# Patient Record
Sex: Male | Born: 1955 | Race: White | Hispanic: No | Smoking: Current some day smoker
Health system: Southern US, Community
[De-identification: ages and names within clinical notes are randomized; demographics above are authoritative.]

## PROBLEM LIST (undated history)

## (undated) DIAGNOSIS — G373 Acute transverse myelitis in demyelinating disease of central nervous system: Secondary | ICD-10-CM

## (undated) DIAGNOSIS — M1990 Unspecified osteoarthritis, unspecified site: Secondary | ICD-10-CM

## (undated) DIAGNOSIS — N319 Neuromuscular dysfunction of bladder, unspecified: Secondary | ICD-10-CM

## (undated) HISTORY — DX: Neuromuscular dysfunction of bladder, unspecified: N31.9

## (undated) HISTORY — DX: Unspecified osteoarthritis, unspecified site: M19.90

---

## 1996-08-29 DIAGNOSIS — G373 Acute transverse myelitis in demyelinating disease of central nervous system: Secondary | ICD-10-CM

## 1996-08-29 HISTORY — DX: Acute transverse myelitis in demyelinating disease of central nervous system: G37.3

## 1997-10-09 ENCOUNTER — Emergency Department (HOSPITAL_COMMUNITY): Admission: EM | Admit: 1997-10-09 | Discharge: 1997-10-09 | Payer: Self-pay | Admitting: Emergency Medicine

## 2000-03-08 ENCOUNTER — Emergency Department (HOSPITAL_COMMUNITY): Admission: EM | Admit: 2000-03-08 | Discharge: 2000-03-08 | Payer: Self-pay | Admitting: Emergency Medicine

## 2000-03-28 ENCOUNTER — Emergency Department (HOSPITAL_COMMUNITY): Admission: EM | Admit: 2000-03-28 | Discharge: 2000-03-28 | Payer: Self-pay | Admitting: Emergency Medicine

## 2000-11-06 ENCOUNTER — Emergency Department: Admission: EM | Admit: 2000-11-06 | Discharge: 2000-11-06 | Payer: Self-pay | Admitting: Emergency Medicine

## 2001-11-10 ENCOUNTER — Emergency Department (HOSPITAL_COMMUNITY): Admission: EM | Admit: 2001-11-10 | Discharge: 2001-11-10 | Payer: Self-pay | Admitting: Emergency Medicine

## 2002-12-02 ENCOUNTER — Emergency Department (HOSPITAL_COMMUNITY): Admission: EM | Admit: 2002-12-02 | Discharge: 2002-12-02 | Payer: Self-pay | Admitting: Emergency Medicine

## 2003-05-04 ENCOUNTER — Emergency Department (HOSPITAL_COMMUNITY): Admission: EM | Admit: 2003-05-04 | Discharge: 2003-05-04 | Payer: Self-pay | Admitting: Emergency Medicine

## 2007-06-13 ENCOUNTER — Encounter: Payer: Self-pay | Admitting: Family Medicine

## 2008-01-04 ENCOUNTER — Encounter: Payer: Self-pay | Admitting: Family Medicine

## 2009-03-17 ENCOUNTER — Encounter: Payer: Self-pay | Admitting: Family Medicine

## 2009-03-22 ENCOUNTER — Encounter: Payer: Self-pay | Admitting: Family Medicine

## 2009-04-08 ENCOUNTER — Encounter: Payer: Self-pay | Admitting: Family Medicine

## 2009-08-25 ENCOUNTER — Encounter: Payer: Self-pay | Admitting: Family Medicine

## 2009-09-21 ENCOUNTER — Emergency Department (HOSPITAL_COMMUNITY): Admission: EM | Admit: 2009-09-21 | Discharge: 2009-09-21 | Payer: Self-pay | Admitting: Emergency Medicine

## 2009-11-25 ENCOUNTER — Ambulatory Visit: Payer: Self-pay | Admitting: Family Medicine

## 2009-11-25 DIAGNOSIS — G894 Chronic pain syndrome: Secondary | ICD-10-CM | POA: Insufficient documentation

## 2009-11-27 ENCOUNTER — Ambulatory Visit (HOSPITAL_COMMUNITY): Admission: RE | Admit: 2009-11-27 | Discharge: 2009-11-27 | Payer: Self-pay | Admitting: Psychiatry

## 2009-12-04 ENCOUNTER — Encounter: Payer: Self-pay | Admitting: Family Medicine

## 2009-12-08 ENCOUNTER — Encounter: Payer: Self-pay | Admitting: Family Medicine

## 2009-12-08 ENCOUNTER — Telehealth: Payer: Self-pay | Admitting: Family Medicine

## 2009-12-10 ENCOUNTER — Encounter: Payer: Self-pay | Admitting: Family Medicine

## 2009-12-10 LAB — CONVERTED CEMR LAB
Opiate Screen, Urine: NEGATIVE
Phencyclidine (PCP): NEGATIVE
Propoxyphene: NEGATIVE

## 2010-01-12 ENCOUNTER — Encounter: Payer: Self-pay | Admitting: Family Medicine

## 2010-03-31 NOTE — Miscellaneous (Signed)
Summary: Controlled Substance Agreement  Controlled Substance Agreement   Imported By: Lanelle Bal 12/04/2009 09:19:11  _____________________________________________________________________  External Attachment:    Type:   Image     Comment:   External Document

## 2010-03-31 NOTE — Letter (Signed)
Summary: [facility]  [facility]   Imported By: Lanelle Bal 12/17/2009 11:47:46  _____________________________________________________________________  External Attachment:    Type:   Image     Comment:   External Document

## 2010-03-31 NOTE — Progress Notes (Signed)
Summary: Oxycontin and oxycodone refills  Phone Note Refill Request   Refills Requested: Medication #1:  OXYCODONE HCL 15 MG TABS Take 1 tab by mouth every 6 hours as needed  Medication #2:  OXYCONTIN 40 MG XR12H-TAB Take 1 tab by mouth three times a day as needed Initial call taken by: Payton Spark CMA,  December 08, 2009 8:24 AM  Follow-up for Phone Call        pt did not have his labs done, so I will not fill his meds. Follow-up by: Seymour Bars DO,  December 08, 2009 9:06 AM     Appended Document: Oxycontin and oxycodone refills Pt advised of the above. Pt first said that he did have drug screen done on the day of his last apt. I called lab to get results, lab had no record of Pt ever coming. I advised Pt and Pt stated that he couldnt repeat lab today bc he did not bring a cath. I told Pt that he would not be getting any refills until he completed labs so Pt went down to lab for drug screen. Pt came back up after screening and requested refills. I advised Pt that we did not have any results and could not fill until results are in. Pt was upset and said that he was going through withdrawls and walked out of the office saying, "this is ridiculous".  Appended Document: Oxycontin and oxycodone refills Pt called back again requesting refills. I advised Pt AGAIN that we did not have drug screen back and he would not get any refills until it comes back. I advised Pt to go to ED if he started to have withdrawl Sxs. I also told Pt that he should have done his labs in Sept and that he signed a contract agreeing to all of this. Pt stated that he didn't even read contract.   Appended Document: Oxycontin and oxycodone refills Pt is exhibiting aggressive behaviors in relation to filling narcotics after he clearly signed a narcotic contract at his last visit and now has varying stories.  I made it CLEAR to him that I needed his labs back before I will RF his meds and he never did them.  I am going to  discharge this patient for this behavior.  Seymour Bars, D.O.  Appended Document: Discharge Letter IDX and EMR updated to reflect dismissal from the practice. Letter sent out by Certified Mail.  Appended Document: Discharge Letter Letter returned as undeliverable. Resent by 1st class mail which does not require a signature. Scanned into EMR.  Appended Document: Discharge Letter Certified delivery attempted three times.

## 2010-03-31 NOTE — Miscellaneous (Signed)
Summary: pain clinic notes  Clinical Lists Changes  Problems: Assessed CHRONIC PAIN SYNDROME as comment only - LBP -- was seen at [facility].  Hx of hereditary and idiopathic peripheral neuroapthy, LBP.   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  Hx of hereditary and idiopathic peripheral neuroapthy, LBP.   Taking Lyrica 150 mg two times a day, oxycodone 15 mg every 4 hrs and oxycontin 40 mg three times a day  Complete Medication List: 1)  Lyrica 150 Mg Caps (Pregabalin) .... Take 1 tab by mouth two times a day 2)  Oxycodone Hcl 15 Mg Tabs (Oxycodone hcl) .... Take 1 tab by mouth every 6 hours as needed 3)  Oxycontin 40 Mg Xr12h-tab (Oxycodone hcl) .... Take 1 tab by mouth three times a day as needed 4)  Cipro 500 Mg Tabs (Ciprofloxacin hcl) .... Take 1 tab by mouth two times a day x 1 week every 6 months.   

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
## 2010-03-31 NOTE — Letter (Signed)
Summary: [facility]  [facility]   Imported By: Lanelle Bal 12/17/2009 11:48:41  _____________________________________________________________________  External Attachment:    Type:   Image     Comment:   External Document

## 2010-03-31 NOTE — Letter (Signed)
Summary: [facility]  [facility]   Imported By: Lanelle Bal 12/17/2009 11:51:02  _____________________________________________________________________  External Attachment:    Type:   Image     Comment:   External Document

## 2010-04-02 NOTE — Letter (Signed)
Summary: Patient Dismissal Activation Form & USPS  returns  Patient Dismissal Activation Form & USPS  returns   Imported By: Lenard Forth 02/20/2010 15:58:47  _____________________________________________________________________  External Attachment:    Type:   Image     Comment:   External Document

## 2010-04-02 NOTE — Letter (Signed)
Summary: Discharge Letter  [facility]  [address]   [place], [place] [address]   Phone: [phone]  Fax: [phone]       12/10/2009 MRN: [id number]  [name] [address] [place], [place]  [address]  Dear Shane Rivera,   I find it necessary to inform you that I will not be able to provide medical care to you, because of inappropriate behavior in relation to use of controlled substances.  Since your condition requires medical attention, I suggest that you place your self under the care of another physician without delay. If you desire, I will be available for emergency care for 30 days after you receive this letter.  This should give you ample time to select a physician of your choice from the many competent providers in this area. You may want to call the local medical society or [facility]'s physician referral service [phone]) for their assistance in locating a new physician. With your written authorization, I will make a copy of your medical record available to your new physician.   Sincerely,    Seymour Bars DO  Appended Document: Discharge Letter IDX and EMR updated to reflect dismissal from the practice. Letter sent out by Certified Mail.  Appended Document: Discharge Letter Letter returned as undeliverable. Resent by 1st class mail which does not require a signature. Scanned into EMR.  Appended Document: Discharge Letter Certified delivery attempted three times.  Appended Document: Discharge Letter 1st class letter returned as unclaimed/unable to forward. Unable to send another letter-need updated address. Letter scanned into chart.

## 2010-05-16 LAB — DIFFERENTIAL
Basophils Absolute: 0 10*3/uL (ref 0.0–0.1)
Basophils Relative: 0 % (ref 0–1)
Eosinophils Absolute: 0.4 10*3/uL (ref 0.0–0.7)
Lymphs Abs: 2.5 10*3/uL (ref 0.7–4.0)
Neutrophils Relative %: 75 % (ref 43–77)

## 2010-05-16 LAB — CBC
MCHC: 33.8 g/dL (ref 30.0–36.0)
Platelets: 225 10*3/uL (ref 150–400)
RBC: 4.07 MIL/uL — ABNORMAL LOW (ref 4.22–5.81)

## 2010-10-13 ENCOUNTER — Inpatient Hospital Stay (INDEPENDENT_AMBULATORY_CARE_PROVIDER_SITE_OTHER)
Admission: RE | Admit: 2010-10-13 | Discharge: 2010-10-13 | Disposition: A | Discharge status: Home / Self Care | Payer: Managed Care, Other (non HMO) | Source: Ambulatory Visit | Attending: Family Medicine | Admitting: Family Medicine

## 2010-10-13 DIAGNOSIS — R6889 Other general symptoms and signs: Secondary | ICD-10-CM
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
## 2012-05-05 ENCOUNTER — Emergency Department (HOSPITAL_BASED_OUTPATIENT_CLINIC_OR_DEPARTMENT_OTHER)
Admission: EM | Admit: 2012-05-05 | Discharge: 2012-05-05 | Disposition: A | Discharge status: Home / Self Care | Payer: Medicaid Other | Attending: Emergency Medicine | Admitting: Emergency Medicine

## 2012-05-05 ENCOUNTER — Encounter (HOSPITAL_BASED_OUTPATIENT_CLINIC_OR_DEPARTMENT_OTHER): Payer: Self-pay | Admitting: Emergency Medicine

## 2012-05-05 DIAGNOSIS — F17200 Nicotine dependence, unspecified, uncomplicated: Secondary | ICD-10-CM | POA: Insufficient documentation

## 2012-05-05 DIAGNOSIS — R300 Dysuria: Secondary | ICD-10-CM | POA: Insufficient documentation

## 2012-05-05 DIAGNOSIS — R109 Unspecified abdominal pain: Secondary | ICD-10-CM | POA: Insufficient documentation

## 2012-05-05 DIAGNOSIS — R339 Retention of urine, unspecified: Secondary | ICD-10-CM | POA: Insufficient documentation

## 2012-05-05 DIAGNOSIS — Z79899 Other long term (current) drug therapy: Secondary | ICD-10-CM | POA: Insufficient documentation

## 2012-05-05 DIAGNOSIS — R319 Hematuria, unspecified: Secondary | ICD-10-CM | POA: Insufficient documentation

## 2012-05-05 HISTORY — DX: Acute transverse myelitis in demyelinating disease of central nervous system: G37.3

## 2012-05-05 LAB — URINALYSIS, MICROSCOPIC ONLY
Leukocytes, UA: NEGATIVE
Nitrite: POSITIVE — AB
Protein, ur: NEGATIVE mg/dL
Specific Gravity, Urine: 1.014 (ref 1.005–1.030)
pH: 7.5 (ref 5.0–8.0)

## 2012-05-05 MED ORDER — LIDOCAINE HCL 2 % EX GEL
CUTANEOUS | Status: AC
  Administered 2012-05-05: 16:00:00
  Filled 2012-05-05: qty 20

## 2012-05-05 MED ORDER — SULFAMETHOXAZOLE-TRIMETHOPRIM 800-160 MG PO TABS: 1.0000 | ORAL_TABLET | Freq: Two times a day (BID) | ORAL | Status: DC

## 2012-05-05 NOTE — ED Notes (Signed)
Pt. self cath's daily. Today, he is unable to get catheter to advance and noticed blood when he removed catheter.

## 2012-05-05 NOTE — ED Provider Notes (Addendum)
History     CSN: [id number]  Arrival date & time 05/05/12  1522   First MD Initiated Contact with Patient 05/05/12 1537      Chief Complaint  Patient presents with  . Urinary Retention    (Consider location/radiation/quality/duration/timing/severity/associated sxs/prior treatment) HPI Comments: PT presents with inability to insert his urinary catheter.  He has been self-cathing for several years due to transverse myelitis.  He has never had problems in the past, but today, has been unable to insert his catheter.  Noted some blood at the tip of the catheter.  The last time that he was unable to empty his bladder was 4am today.  Has some pressure and discomfort to his lower abdomen.  No fevers.  No n/v.  No bowel issues.  Does not have a urologist or PMD.  Is followed only by pain management.   Past Medical History  Diagnosis Date  . Transverse myelitis     History reviewed. No pertinent past surgical history.  No family history on file.  History  Substance Use Topics  . Smoking status: Current Every Day Smoker  . Smokeless tobacco: Not on file  . Alcohol Use: No      Review of Systems  Constitutional: Negative for fever, chills, diaphoresis and fatigue.  HENT: Negative for congestion, rhinorrhea and sneezing.   Eyes: Negative.   Respiratory: Negative for cough, chest tightness and shortness of breath.   Cardiovascular: Negative for chest pain and leg swelling.  Gastrointestinal: Positive for abdominal pain (suprapubic). Negative for nausea, vomiting, diarrhea and blood in stool.  Genitourinary: Positive for dysuria and hematuria. Negative for frequency, flank pain and difficulty urinating.  Musculoskeletal: Negative for back pain and arthralgias.  Skin: Negative for rash.  Neurological: Negative for dizziness, speech difficulty, weakness, numbness and headaches.    Allergies  Review of patient's allergies indicates no known allergies.  Home Medications   Current  Outpatient Rx  Name  Route  Sig  Dispense  Refill  . methadone (DOLOPHINE) 10 MG tablet   Oral   Take 10 mg by mouth 3 (three) times daily.         Marland Kitchen oxycodone (ROXICODONE) 30 MG immediate release tablet   Oral   Take 30 mg by mouth every 4 (four) hours as needed for pain.         . pregabalin (LYRICA) 150 MG capsule   Oral   Take 150 mg by mouth 2 (two) times daily.         Marland Kitchen sulfamethoxazole-trimethoprim (SEPTRA DS) 800-160 MG per tablet   Oral   Take 1 tablet by mouth every 12 (twelve) hours.   14 tablet   0     BP 136/78  Pulse 94  Temp(Src) 98.1 F (36.7 C) (Oral)  Resp 18  Ht 6\' 2"  (1.88 m)  Wt 170 lb (77.111 kg)  BMI 21.82 kg/m2  SpO2 100%  Physical Exam  Constitutional: He is oriented to person, place, and time. He appears well-developed and well-nourished.  HENT:  Head: Normocephalic and atraumatic.  Eyes: Pupils are equal, round, and reactive to light.  Neck: Normal range of motion. Neck supple.  Cardiovascular: Normal rate, regular rhythm and normal heart sounds.   Pulmonary/Chest: Effort normal and breath sounds normal. No respiratory distress. He has no wheezes. He has no rales. He exhibits no tenderness.  Abdominal: Soft. Bowel sounds are normal. There is tenderness (mild TTP suprapubic region). There is no rebound and no guarding.  Musculoskeletal: Normal  range of motion. He exhibits no edema.  Lymphadenopathy:    He has no cervical adenopathy.  Neurological: He is alert and oriented to person, place, and time.  Skin: Skin is warm and dry. No rash noted.  Psychiatric: He has a normal mood and affect.    ED Course  Procedures (including critical care time)  Results for orders placed during the hospital encounter of 05/05/12  URINALYSIS, MICROSCOPIC ONLY      Result Value Range   Color, Urine YELLOW  YELLOW   APPearance CLOUDY (*) CLEAR   Specific Gravity, Urine 1.014  1.005 - 1.030   pH 7.5  5.0 - 8.0   Glucose, UA NEGATIVE  NEGATIVE  mg/dL   Hgb urine dipstick LARGE (*) NEGATIVE   Bilirubin Urine NEGATIVE  NEGATIVE   Ketones, ur NEGATIVE  NEGATIVE mg/dL   Protein, ur NEGATIVE  NEGATIVE mg/dL   Urobilinogen, UA 1.0  0.0 - 1.0 mg/dL   Nitrite POSITIVE (*) NEGATIVE   Leukocytes, UA NEGATIVE  NEGATIVE   WBC, UA 0-2  <3 WBC/hpf   RBC / HPF 11-20  <3 RBC/hpf   Squamous Epithelial / LPF RARE  RARE   No results found.    1. Urinary retention       MDM  A Foley catheter was placed and patient had significant urinary output following this. His suprapubic discomfort resolved. His urine was sent for culture. I did start him on Bactrim. He has not started on Cipro due to the interaction with methadone and prolongation of the QT interval. I did stress that he'll need to followup with the urologist within the next week for reassessment and Foley catheter removal. I gave him a referral to Dr. Margarita Grizzle with [facility]. Advised to return here if he has any worsening symptoms or other issues.  Pt describes some burning pain to his bladder area.  No pain to penis.  Will verify foley placement.  Discharge with leg bag.      Rolan Bucco, MD 05/05/12 1610  Rolan Bucco, MD 05/05/12 [id number]

## 2012-05-05 NOTE — ED Notes (Signed)
Foley repositioned for comfort and leg bag applied

## 2012-05-05 NOTE — ED Notes (Signed)
MD at bedside. 

## 2012-05-06 NOTE — ED Notes (Signed)
Attempted to call in septra prescription to [facility].  No answer at [phone]

## 2012-05-07 NOTE — ED Notes (Signed)
Received call from patients girlfriend.  She related that the prescription is to be called into [facility] on [address].  Called [facility] on [address], [phone] for Septra DS, one tablet twice a day for 7 days, #14, no refills for Rolan Bucco, MD

## 2012-05-07 NOTE — ED Notes (Signed)
Pt's girlfriend has called [facility] again stating that they are at the [facility] on [address] and [address] and the prescription is not there.  She is angry that medication was not present.  She provided the incorrect pharmacy info to [facility], RN who attempted to call this medication in.  This RN has contacted the [facility] at [address] ([phone]) and a message was left for the pharmacist to fill Septra DS tablets, 1 tablet twice daily by mouth, for a total of 14 tablets, no refills.

## 2012-05-07 NOTE — ED Notes (Signed)
Received phone call from girlfriend.  She was angry, wanting to know why the prescription was not at the store.  Informed pt that the prescription was called into [facility] as instructed.  Pt states she wanted it called into the [facility] at [address].  Pt angry, states "this is ridiculous, he has been waiting for three days without a prescription".  Informed girlfriend that I was sorry she was upset, and that I had been trying to call in the prescription for her.  She stated that I did not call the [place] [address] pharmacy.  I instructed the patient that [address] was what I found in the computer.  Girlfriend hung up on this nurse before I could say anything else.

## 2012-05-08 LAB — URINE CULTURE: Special Requests: NORMAL

## 2012-05-09 NOTE — ED Notes (Signed)
+  urine Patient treated with Septra-sensitive to same-chart appended per protocol MD. 

## 2012-06-05 ENCOUNTER — Ambulatory Visit: Payer: Self-pay | Admitting: Emergency Medicine

## 2012-06-05 VITALS — BP 126/78 | HR 88 | Temp 98.6°F | Resp 17 | Ht 73.0 in | Wt 164.0 lb

## 2012-06-05 DIAGNOSIS — G894 Chronic pain syndrome: Secondary | ICD-10-CM

## 2012-06-05 DIAGNOSIS — G1221 Amyotrophic lateral sclerosis: Secondary | ICD-10-CM

## 2012-06-05 MED ORDER — OXYCODONE HCL 30 MG PO TABS: 30.0000 mg | ORAL_TABLET | Freq: Four times a day (QID) | ORAL | Status: DC | PRN

## 2012-06-05 MED ORDER — CIPROFLOXACIN HCL 500 MG PO TABS: 500.0000 mg | ORAL_TABLET | Freq: Two times a day (BID) | ORAL | Status: DC

## 2012-06-05 MED ORDER — PREGABALIN 150 MG PO CAPS: 150.0000 mg | ORAL_CAPSULE | Freq: Two times a day (BID) | ORAL | Status: DC
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
## 2012-06-05 NOTE — Patient Instructions (Signed)
[facility] Policy for Prescribing Controlled Substances (Revised 12/2011) 1. Prescriptions for controlled substances will be filled by ONE provider at [facility] with whom you have established and developed a plan for your care, including follow-up. 2. You are encouraged to schedule an appointment with your prescriber at our appointment center for follow-up visits whenever possible. 3. If you request a prescription for the controlled substance while at [facility] for an acute problem (with someone other than your regular prescriber), you MAY be given a ONE-TIME prescription for a 30-day supply of the controlled substance, to allow time for you to return to see your regular prescriber for additional prescriptions. 

## 2012-06-06 ENCOUNTER — Telehealth: Payer: Self-pay

## 2012-06-06 NOTE — Telephone Encounter (Signed)
LMOM to CB. Trying to do PA on Lyrica for pt. Clarify w/pt how long he has been taking Lyrica, is he taking it for neuropathic pain? Has he tried/failed any other meds for this (see list on PA form at [facility] desk).
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
## 2012-06-07 ENCOUNTER — Ambulatory Visit (INDEPENDENT_AMBULATORY_CARE_PROVIDER_SITE_OTHER): Payer: Self-pay | Admitting: Medical

## 2012-06-07 ENCOUNTER — Encounter: Payer: Self-pay | Admitting: Medical

## 2012-06-07 VITALS — BP 120/80 | HR 88 | Temp 98.1°F | Resp 16 | Wt 173.0 lb

## 2012-06-07 DIAGNOSIS — G709 Myoneural disorder, unspecified: Secondary | ICD-10-CM

## 2012-06-07 DIAGNOSIS — G894 Chronic pain syndrome: Secondary | ICD-10-CM

## 2012-06-07 DIAGNOSIS — G5793 Unspecified mononeuropathy of bilateral lower limbs: Secondary | ICD-10-CM

## 2012-06-07 DIAGNOSIS — G5790 Unspecified mononeuropathy of unspecified lower limb: Secondary | ICD-10-CM

## 2012-06-07 NOTE — Patient Instructions (Signed)
Shane Rivera  I have taken the time to look over your records, to contact some of your prior medical providers, and reviewed some of your prior medical refill history.    My concerns are that in recent months, you have used multiple pharmacies for narcotics, multiple providers for narcotics, just got #60 Oxycodone 2 days ago through the emergency department, and you are here asking me for additional quantity of this medication today.  Even in the past week you have used more than 1 pharmacy for narcotic refills.    I can't in good conscious prescribe you controlled substances.    If you would like, I will refer you to a pain clinic here in [place].  I will not take over your controlled substance prescriptions.  Regarding general health care, I will be glad to see you for not pain related issues, but you will need to see either your neurologist in [place], or a local pain clinic for pain medications.

## 2012-06-07 NOTE — Progress Notes (Signed)
Subjective: Here as a new patient today.    He reports a hx/o Tranverse Myelitis in 1998, subsequent chronic issues with severe burning pains in buttocks and legs for years, uses a urinary catheter for hx/o bladder problems and incontinence.   He reports being diagnosed with ALS 09/2011, sees Dr. Zannie Kehr at [facility].   He reports that his mother, brother and sister all died of ALS.  He recently moved back to [place] after a 1.5 months stay with his sister in [place].  They couldn't get along, so he moved back to [place].  He has lived here for a long time prior to moving to [place].    He reports that he was seeing a different doctor, Dr. Bascom Levels, but he can no longer see him due to Dr. Bascom Levels retiring.   He was seeing another provider Mrs. Manson Passey, NP, but didn't feel comfortable going back to that practice.   He has been told that possible life expectancy could be 18 months or so.   He wants to have as good a quality of life in his remaining days.   He plans to travel to [place] this weekend, and would like to get refills on his Oxycodone that he takes 4 times daily as well as his Lyrica since he is not sure when he will be back in town.  He is currently living in a boarding house.    Objective: Filed Vitals:   06/07/12 1059  BP: 120/80  Pulse: 88  Temp: 98.1 F (36.7 C)  Resp: 16   Gen: lean white male, somewhat flat affect today, nad, not cachetic appearing Skin: warm, dry HEENT: normocephalic, sclerae anicteric, TMs pearly, nares patent, no discharge or erythema, pharynx normal Oral cavity: MMM, several teeth in decay Neck: supple, no lymphadenopathy, no thyromegaly, no masses, good tone Heart: RRR, normal S1, S2, no murmurs Lungs: CTA bilaterally, no wheezes, rhonchi, or rales Abdomen: +bs, soft, non tender, non distended, no masses, no hepatomegaly, no splenomegaly Pulses: 2+ symmetric, upper and lower extremities, normal cap refill Neuro: CN2-12 intact, elbow extension bilat  seems 4/5 strength, but flexion normal, left leg throughout with 4/5 strength compared to 5/5 strength right leg, DTRs seem 3+ increased throughout, sensation seems normal, no clonus, gait seems relatively normal, no obvious atrophy, but there does seem to be some spasm of the legs intermittently Ext: no edema MSK: nontender, no obvious muscle atrophy, otherwise unremarkable throughout   Assessment: Encounter Diagnoses  Name Primary?  . Neuromuscular disease Yes  . Neuropathic pain of both legs   . Chronic pain syndrome     Plan: I took the time to review some of his prior records he brought, I reviewed a [place] Controlled Substance report showing at least 4 different prescribers for Oxycodone  since May 2013, 8 different pharmacies, and using his name and DOB gives multiple prior addresses.   I called the pharmacy he noted to be his primary pharmacy, but found out he actually uses [facility] [address] primarily.  Apparently he has been using several pharmacies routinely.     I called and spoke with Mrs. Manson Passey, NP, one of his longer term prior providers.   She reports that she actually had been taking care of his pain regularly until he was dishonest about refills.   She notes that he saw her Monday just 3 days ago, didn't pay for his appointment charges, had just got #120 Oxycodone form [place], and wasn't compliant with their agreement regarding controlled substances.  Thus, she will not prescribe any more for him.   He didn't disclose this info to me today.     I also spoke to Dr. Bascom Levels who also use to see him for primary care and prescribe his narcotics.   Dr. Bascom Levels is retiring and closing his practice.  He apparently had referred Mr. Krygier to a pain clinic, but Mr. Morriss didn't bring this up today.  I reviewed [facility] report, and he was just prescribed #60 Oxycodone 2 days ago in the emergency dept.  Looking back over his prescription history, he has gotten numerous refills recently  that are overlapping.  The confusing part is that records he brings in has limited info about prior treatment with OxyContin BID that he described as being on long term and stable, but he tells me he is on Oxycodone QID getting #120 per refill which is not consistent with recent quantities.   Additionally, he notes being followed by Dr. Zannie Kehr at [facility] for ALS diagnosed 8/13.  However, he says in another statement that he has been seeing Dr. Zannie Kehr for years prior, but hasn't actually had an appointment with him in a few years.  He also states that Dr. Zannie Kehr doesn't treat his pain nor is he aware of his pain issue.  Thus, I don't now what to believe with Mr. Cuffe.    I expressed my concerns for his health and overall state of things with a diagnosis of ALS.  I advised that the best place to start with his care moving forward is his neurologist at [facility] regarding ALS and the pain issue.  I also feel that he is having issues with narcotic use, and not obtaining these in an appropriate fashion given the multiple pharmacies, multiple prescribers, multiple addresses and recent travel back and forth between different states.  Before I could finish discussing a plan forwards, possible pain clinic referral, he stated that if I wasn't going to refill his narcotics, then I was wasting his time.  He got up and left the room and the building.

## 2012-06-12 ENCOUNTER — Ambulatory Visit (INDEPENDENT_AMBULATORY_CARE_PROVIDER_SITE_OTHER): Payer: Self-pay | Admitting: Family Medicine

## 2012-06-12 ENCOUNTER — Encounter: Payer: Self-pay | Admitting: Family Medicine

## 2012-06-12 DIAGNOSIS — IMO0001 Reserved for inherently not codable concepts without codable children: Secondary | ICD-10-CM

## 2012-06-12 DIAGNOSIS — Z049 Encounter for examination and observation for unspecified reason: Secondary | ICD-10-CM

## 2012-06-12 NOTE — Progress Notes (Signed)
  Subjective:    Patient ID: Shane Rivera, male    DOB: 08/09/1955, 57 y.o.   MRN: [id number]  HPI He is here for a followup. He called this morning stating he was going to come in for an unscheduled visit. He stated that he called the medical board and they stated that we had to either give him a prescription or give him his money back. Review his record indicates he has received pain meds from multiple sources and given multiple different addresses. See previous encounter to get more details. I informed him of all of this. At that point, he decided to leave. I recommended that he get one physician to handle his medical needs I also informed him that he would be contacted by vice and narcotics .A letter of dismissal was handed to him. This was witnessed by my office manager Laureen Ochs and Kristian Covey   Review of Systems     Objective:   Physical Exam        Assessment & Plan:

## 2012-06-22 ENCOUNTER — Telehealth: Payer: Self-pay

## 2012-06-22 NOTE — Telephone Encounter (Signed)
Called to check w/pharmacy to see if they were able to get Lyrica Rx to go through. I had done PA, but never heard from the ins co. Pharmacist reported that it still does not go through but stated pt does not want it at this time anyway, it is on hold. Reported that pt has been going to multiple providers to get Rxs for Oxycodone and this is the only medication he wants. Documenting this information for future requests or OVs.

## 2012-06-28 NOTE — Telephone Encounter (Signed)
See notes on 06/22/12 phone message.

## 2012-08-17 ENCOUNTER — Ambulatory Visit: Payer: Medicaid Other | Admitting: Neurology

## 2012-09-11 ENCOUNTER — Emergency Department (HOSPITAL_COMMUNITY)
Admission: EM | Admit: 2012-09-11 | Discharge: 2012-09-11 | Disposition: A | Discharge status: Home / Self Care | Payer: Medicaid Other | Attending: Emergency Medicine | Admitting: Emergency Medicine

## 2012-09-11 ENCOUNTER — Encounter (HOSPITAL_COMMUNITY): Payer: Self-pay | Admitting: Emergency Medicine

## 2012-09-11 DIAGNOSIS — Z8739 Personal history of other diseases of the musculoskeletal system and connective tissue: Secondary | ICD-10-CM | POA: Insufficient documentation

## 2012-09-11 DIAGNOSIS — Z792 Long term (current) use of antibiotics: Secondary | ICD-10-CM | POA: Insufficient documentation

## 2012-09-11 DIAGNOSIS — S01511A Laceration without foreign body of lip, initial encounter: Secondary | ICD-10-CM

## 2012-09-11 DIAGNOSIS — S01501A Unspecified open wound of lip, initial encounter: Secondary | ICD-10-CM | POA: Insufficient documentation

## 2012-09-11 DIAGNOSIS — Z8669 Personal history of other diseases of the nervous system and sense organs: Secondary | ICD-10-CM | POA: Insufficient documentation

## 2012-09-11 DIAGNOSIS — F17200 Nicotine dependence, unspecified, uncomplicated: Secondary | ICD-10-CM | POA: Insufficient documentation

## 2012-09-11 DIAGNOSIS — Z79899 Other long term (current) drug therapy: Secondary | ICD-10-CM | POA: Insufficient documentation
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
## 2012-09-11 NOTE — ED Provider Notes (Signed)
Medical screening examination/treatment/procedure(s) were performed by non-physician practitioner and as supervising physician I was immediately available for consultation/collaboration.    Vida Roller, MD 09/11/12 2008
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
## 2013-05-28 ENCOUNTER — Ambulatory Visit: Payer: Medicaid Other | Admitting: Family Medicine

## 2013-06-22 ENCOUNTER — Ambulatory Visit: Payer: Self-pay | Admitting: Family Medicine

## 2013-06-22 VITALS — BP 148/100 | HR 96 | Temp 98.2°F | Resp 16 | Ht 72.0 in | Wt 165.0 lb

## 2013-06-22 DIAGNOSIS — G629 Polyneuropathy, unspecified: Secondary | ICD-10-CM

## 2013-06-22 DIAGNOSIS — M25579 Pain in unspecified ankle and joints of unspecified foot: Secondary | ICD-10-CM

## 2013-06-22 DIAGNOSIS — G894 Chronic pain syndrome: Secondary | ICD-10-CM

## 2013-06-22 DIAGNOSIS — G609 Hereditary and idiopathic neuropathy, unspecified: Secondary | ICD-10-CM

## 2013-06-22 DIAGNOSIS — N319 Neuromuscular dysfunction of bladder, unspecified: Secondary | ICD-10-CM

## 2013-06-22 DIAGNOSIS — Z8669 Personal history of other diseases of the nervous system and sense organs: Secondary | ICD-10-CM

## 2013-06-22 MED ORDER — OXYCODONE HCL 30 MG PO TABS: 30.0000 mg | ORAL_TABLET | Freq: Four times a day (QID) | ORAL | Status: DC | PRN

## 2013-06-22 MED ORDER — CIPROFLOXACIN HCL 500 MG PO TABS
500.0000 mg | ORAL_TABLET | Freq: Two times a day (BID) | ORAL | Status: DC
Start: 2013-06-22 — End: 2013-07-20

## 2013-06-22 NOTE — Progress Notes (Signed)
Subjective: 58 year old male with a bad peripheral neuropathy who comes in here asking for referral to a pain clinic. He missed too many of his appointment with his previous physician, so they stopped prescribing for him. He has been to the emergency room the last time a couple of weeks ago and got a prescription for oxycodone 30 mg which he takes one every 6 hours. His neuropathy symptoms from having Guillian-Barre Syndrome in 1998 treated by Dr. Sandria ManlyLove. He was told that his peripheral neuropathy symptoms were go away within a decade, but they never have. He has discomfort and hypersensitivity of his feet and symptoms on up to his buttocks. It never involved his upper extremities. He does have to use a cath was to void. 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 Family genetic history of ALS, with numerous relatives having died of this. He is not certain whether he has had or not, and really doesn't want to know though he did see a neurologist 4 months ago. SOD1 mutation. Chronic or recurrent urinary tract infections because he has to catheterize himself several times daily.  Objective: Reflexes good in his legs. He has abnormal sensation.  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 Explained I cannot be a long-term pain care physician here. Will give a one-month supply of the medication like he has been getting enough the past [facility], Dr. Chaya JanJohn Galbraith.

## 2013-06-22 NOTE — Patient Instructions (Signed)
Referral has been made to a pain clinic  Continue your current pain medication  We can not serve as chronic pain medication providers from this practice.

## 2013-07-20 ENCOUNTER — Ambulatory Visit: Payer: Self-pay | Admitting: Family Medicine

## 2013-07-20 ENCOUNTER — Encounter: Payer: Self-pay | Admitting: Family Medicine

## 2013-07-20 VITALS — BP 138/93 | HR 106 | Temp 98.4°F | Resp 18 | Ht 73.0 in | Wt 165.0 lb

## 2013-07-20 DIAGNOSIS — G894 Chronic pain syndrome: Secondary | ICD-10-CM

## 2013-07-20 DIAGNOSIS — G629 Polyneuropathy, unspecified: Secondary | ICD-10-CM

## 2013-07-20 DIAGNOSIS — Z1211 Encounter for screening for malignant neoplasm of colon: Secondary | ICD-10-CM

## 2013-07-20 DIAGNOSIS — M25579 Pain in unspecified ankle and joints of unspecified foot: Secondary | ICD-10-CM

## 2013-07-20 DIAGNOSIS — G609 Hereditary and idiopathic neuropathy, unspecified: Secondary | ICD-10-CM

## 2013-07-20 DIAGNOSIS — Z8669 Personal history of other diseases of the nervous system and sense organs: Secondary | ICD-10-CM

## 2013-07-20 MED ORDER — OXYCODONE HCL 30 MG PO TABS: 30.0000 mg | ORAL_TABLET | Freq: Four times a day (QID) | ORAL | Status: DC | PRN

## 2013-07-20 NOTE — Patient Instructions (Signed)
Please do some research on-line and look at the different Pain Management Centers in [place].  If you find a clinic that you would like to be referred to you are welcome to call and leave a message and I will be happy to place that referral.

## 2013-07-20 NOTE — Progress Notes (Signed)
   Subjective:    Patient ID: Shane Rivera, male    DOB: [birth year], 58 y.o.   MRN: [id number] Chief Complaint  Patient presents with  . 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  Has not seen any other doctors in [place] or any pain management in [place].   Has never had a colonoscopy prior. Family Hx of ALS - he did have a genetic mutation.  Lost a mother, brother, and sister -  Transverse myelitis  Past Medical History  Diagnosis Date  . Transverse myelitis   . Arthritis    Current Outpatient Prescriptions on File Prior to Visit  Medication Sig Dispense Refill  . gabapentin (NEURONTIN) 300 MG capsule Take 300 mg by mouth 3 (three) times daily.       No current facility-administered medications on file prior to visit.   No Known Allergies   Review of Systems  Constitutional: Negative for fever, chills, diaphoresis, activity change and appetite change.  Musculoskeletal: Positive for arthralgias, back pain, joint swelling and myalgias. Negative for gait problem.  Skin: Negative for color change, rash and wound.  Neurological: Positive for weakness. Negative for numbness.  Hematological: Negative for adenopathy. Does not bruise/bleed easily.      BP 138/93  Pulse 106  Temp(Src) 98.4 F (36.9 C)  Resp 18  Ht 6\' 1"  (1.854 m)  Wt 165 lb (74.844 kg)  BMI 21.77 kg/m2  SpO2 99% Objective:   Physical Exam  Constitutional: He is oriented to person, place, and time. He appears well-developed and well-nourished. No distress.  HENT:  Head: Normocephalic and atraumatic.  Eyes: No scleral icterus.  Pulmonary/Chest: Effort normal.  Neurological: He is alert and oriented to person, place, and time.  Skin: Skin is warm and dry. He is not diaphoretic.  Psychiatric: He has a normal mood and affect. His behavior is normal.          Assessment & Plan:     Chronic pain syndrome - Plan: Ambulatory referral to Pain Clinic  History of Guillain-Barre syndrome - Plan: Ambulatory referral to Pain Clinic, DISCONTINUED: oxycodone (ROXICODONE) 30 MG immediate release tablet  Peripheral neuropathy - Plan: Ambulatory referral to Pain Clinic, DISCONTINUED: oxycodone (ROXICODONE) 30 MG immediate release tablet  Pain in joint, ankle and foot - Plan: DISCONTINUED: oxycodone (ROXICODONE) 30 MG immediate release tablet  Special screening for malignant neoplasms, colon - Plan: Ambulatory referral to Gastroenterology  Meds ordered this encounter  Medications  . oxycodone (ROXICODONE) 30 MG immediate release tablet    Sig: Take 1 tablet (30 mg total) by mouth every 6 (six) hours as needed for pain. May fill medication on or after 07/22/13.    Dispense:  120 tablet    Refill:  0    Norberto Sorenson, MD MPH

## 2013-08-18 ENCOUNTER — Ambulatory Visit (INDEPENDENT_AMBULATORY_CARE_PROVIDER_SITE_OTHER): Payer: Self-pay | Admitting: Family Medicine

## 2013-08-18 VITALS — BP 124/80 | HR 88 | Temp 98.6°F | Resp 18 | Ht 73.5 in | Wt 167.0 lb

## 2013-08-18 DIAGNOSIS — G629 Polyneuropathy, unspecified: Secondary | ICD-10-CM

## 2013-08-18 DIAGNOSIS — M25579 Pain in unspecified ankle and joints of unspecified foot: Secondary | ICD-10-CM

## 2013-08-18 DIAGNOSIS — G894 Chronic pain syndrome: Secondary | ICD-10-CM

## 2013-08-18 DIAGNOSIS — Z8669 Personal history of other diseases of the nervous system and sense organs: Secondary | ICD-10-CM

## 2013-08-18 DIAGNOSIS — G609 Hereditary and idiopathic neuropathy, unspecified: Secondary | ICD-10-CM

## 2013-08-18 MED ORDER — OXYCODONE HCL 30 MG PO TABS: 30.0000 mg | ORAL_TABLET | Freq: Four times a day (QID) | ORAL | Status: DC | PRN

## 2013-08-18 MED ORDER — CATHETERS MISC: 1.0000 | Freq: Every day | Status: DC

## 2013-08-18 NOTE — Progress Notes (Signed)
Subjective:  This chart was scribed for Norberto SorensonEva Shaw, MD, by Yevette EdwardsAngela Bracken, ED Scribe. This patient's care was started 12:46 PM.    Patient ID: Shane Rivera, male    DOB: 02/10/1956, 58 y.o.   MRN: [id number]  Chief Complaint  Patient presents with  . rx refills    oxycodone    HPI  Shane Rivera is a 58 y.o. male who [facility] to [facility] requesting a medication refill of oxycodone. He has peripheral neuropathy and is on chronic narcotics. 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 He reports hypersensitivity which radiates upwards to his buttocks.   The pt reports he has an [facility] on 7[facility] with Dr. Oneal GroutGarvin.   He continues to take gabapentin, but he does not need a refill.   Additionally, Shane Rivera has to self-cath 5 times daily to urinate, and he requests a prescription for French 14 catheters.   The pt also has a strong family h/o ALS, and he believes he may have the mutation and he is concerned that this will develop.   Shane Rivera leaves later today for work. 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 He states he loves his job.   Past Medical History  Diagnosis Date  . Transverse myelitis   . Arthritis    Current Outpatient Prescriptions on File Prior to Visit  Medication Sig Dispense Refill  . gabapentin (NEURONTIN) 300 MG capsule Take 300 mg by mouth 3 (three) times daily.      Marland Kitchen. oxycodone (ROXICODONE) 30 MG immediate release tablet Take 1 tablet (30 mg total) by mouth every 6 (six) hours as needed for pain. May fill medication on or after 07/22/13.  120 tablet  0   No current facility-administered medications on file prior to visit.   No Known Allergies   Review of Systems  Constitutional: Negative for fever and chills.  Genitourinary: Positive for difficulty urinating.  Musculoskeletal: Positive for myalgias.     Triage Vitals: BP 124/80  Pulse 88  Temp(Src) 98.6 F (37 C) (Oral)   Resp 18  Ht 6' 1.5" (1.867 m)  Wt 167 lb (75.751 kg)  BMI 21.73 kg/m2  SpO2 97%    Objective:   Physical Exam  Nursing note and vitals reviewed. Constitutional: He is oriented to person, place, and time. He appears well-developed and well-nourished. No distress.  HENT:  Head: Normocephalic and atraumatic.  Eyes: Conjunctivae and EOM are normal.  Neck: Neck supple. No tracheal deviation present.  Cardiovascular: Normal rate.   Pulmonary/Chest: Effort normal. No respiratory distress.  Musculoskeletal: Normal range of motion.  Neurological: He is alert and oriented to person, place, and time.  Skin: Skin is warm and dry.  Psychiatric: He has a normal mood and affect. His behavior is normal.      Assessment & Plan:  [place] CSRS reviewed: he had oxycodone 30 mg number 120 prescribed on 4/24 by Dr. Alwyn RenHopper and by myself on 5/24. Refilled pt's oxycodone and provided pt a prescription for [place] catheters.    Chronic pain syndrome - Plan: DISCONTINUED: oxycodone (ROXICODONE) 30 MG immediate release tablet, CANCELED: PR INTERMITTENT URINARY CATH, CANCELED: PR INTERMITTENT URINARY CATH, CANCELED: PR INTERMITTENT URINARY CATH, CANCELED: PR INTERMITTENT URINARY CATH, DISCONTINUED: oxycodone (ROXICODONE) 30 MG immediate release tablet  History of Guillain-Barre syndrome - Plan: DISCONTINUED: oxycodone (ROXICODONE) 30 MG immediate release tablet, CANCELED: PR INTERMITTENT URINARY CATH, CANCELED: PR INTERMITTENT  URINARY CATH, CANCELED: PR INTERMITTENT URINARY CATH, CANCELED: PR INTERMITTENT URINARY CATH, DISCONTINUED: oxycodone (ROXICODONE) 30 MG immediate release tablet  Peripheral neuropathy - Plan: DISCONTINUED: oxycodone (ROXICODONE) 30 MG immediate release tablet, CANCELED: PR INTERMITTENT URINARY CATH, CANCELED: PR INTERMITTENT URINARY CATH, CANCELED: PR INTERMITTENT URINARY CATH, CANCELED: PR INTERMITTENT URINARY CATH, DISCONTINUED: oxycodone (ROXICODONE) 30 MG immediate release tablet  Pain in  joint, ankle and foot, unspecified laterality - Plan: DISCONTINUED: oxycodone (ROXICODONE) 30 MG immediate release tablet, CANCELED: PR INTERMITTENT URINARY CATH, CANCELED: PR INTERMITTENT URINARY CATH, CANCELED: PR INTERMITTENT URINARY CATH, CANCELED: PR INTERMITTENT URINARY CATH, DISCONTINUED: oxycodone (ROXICODONE) 30 MG immediate release tablet  Meds ordered this encounter  Medications  . oxycodone (ROXICODONE) 30 MG immediate release tablet    Sig: Take 1 tablet (30 mg total) by mouth every 6 (six) hours as needed for pain. May fill medication on or after 08/21/13.    Dispense:  120 tablet    Refill:  0  . Catheters MISC    Sig: 1 each by Does not apply route 5 (five) times daily.    Dispense:  150 each    Refill:  11  . oxycodone (ROXICODONE) 30 MG immediate release tablet    Sig: Take 1 tablet (30 mg total) by mouth every 6 (six) hours as needed for pain. May fill medication on or after 08/18/13.    Dispense:  120 tablet    Refill:  0    I personally performed the services described in this documentation, which was scribed in my presence. The recorded information has been reviewed and considered, and addended by me as needed.  Norberto SorensonEva Shaw, MD MPH

## 2013-08-18 NOTE — Patient Instructions (Signed)

## 2013-08-27 ENCOUNTER — Encounter: Payer: Self-pay | Admitting: Family Medicine

## 2013-09-17 ENCOUNTER — Ambulatory Visit (INDEPENDENT_AMBULATORY_CARE_PROVIDER_SITE_OTHER): Payer: Self-pay | Admitting: Internal Medicine

## 2013-09-17 VITALS — BP 130/86 | HR 67 | Temp 98.1°F | Resp 16 | Ht 73.5 in | Wt 165.4 lb

## 2013-09-17 DIAGNOSIS — G629 Polyneuropathy, unspecified: Secondary | ICD-10-CM

## 2013-09-17 DIAGNOSIS — G0489 Other myelitis: Secondary | ICD-10-CM

## 2013-09-17 DIAGNOSIS — M792 Neuralgia and neuritis, unspecified: Secondary | ICD-10-CM

## 2013-09-17 DIAGNOSIS — Z8669 Personal history of other diseases of the nervous system and sense organs: Secondary | ICD-10-CM

## 2013-09-17 DIAGNOSIS — G894 Chronic pain syndrome: Secondary | ICD-10-CM

## 2013-09-17 DIAGNOSIS — G373 Acute transverse myelitis in demyelinating disease of central nervous system: Secondary | ICD-10-CM | POA: Insufficient documentation

## 2013-09-17 DIAGNOSIS — G609 Hereditary and idiopathic neuropathy, unspecified: Secondary | ICD-10-CM

## 2013-09-17 DIAGNOSIS — IMO0002 Reserved for concepts with insufficient information to code with codable children: Secondary | ICD-10-CM

## 2013-09-17 DIAGNOSIS — M25579 Pain in unspecified ankle and joints of unspecified foot: Secondary | ICD-10-CM

## 2013-09-17 MED ORDER — OXYCODONE HCL 30 MG PO TABS: 30.0000 mg | ORAL_TABLET | Freq: Four times a day (QID) | ORAL | Status: DC | PRN

## 2013-09-17 NOTE — Patient Instructions (Signed)
Neuropathic Pain We often think that pain has a physical cause. If we get rid of the cause, the pain should go away. Nerves themselves can also cause pain. It is called neuropathic pain, which means nerve abnormality. It may be difficult for the patients who have it and for the treating caregivers. Pain is usually described as acute (short-lived) or chronic (long-lasting). Acute pain is related to the physical sensations caused by an injury. It can last from a few seconds to many weeks, but it usually goes away when normal healing occurs. Chronic pain lasts beyond the typical healing time. With neuropathic pain, the nerve fibers themselves may be damaged or injured. They then send incorrect signals to other pain centers. The pain you feel is real, but the cause is not easy to find.  CAUSES  Chronic pain can result from diseases, such as diabetes and shingles (an infection related to chickenpox), or from trauma, surgery, or amputation. It can also happen without any known injury or disease. The nerves are sending pain messages, even though there is no identifiable cause for such messages.   Other common causes of neuropathy include diabetes, phantom limb pain, or Regional Pain Syndrome (RPS).  As with all forms of chronic back pain, if neuropathy is not correctly treated, there can be a number of associated problems that lead to a downward cycle for the patient. These include depression, sleeplessness, feelings of fear and anxiety, limited social interaction and inability to do normal daily activities or work.  The most dramatic and mysterious example of neuropathic pain is called "phantom limb syndrome." This occurs when an arm or a leg has been removed because of illness or injury. The brain still gets pain messages from the nerves that originally carried impulses from the missing limb. These nerves now seem to misfire and cause troubling pain.  Neuropathic pain often seems to have no cause. It responds  poorly to standard pain treatment. Neuropathic pain can occur after:  Shingles (herpes zoster virus infection).  A lasting burning sensation of the skin, caused usually by injury to a peripheral nerve.  Peripheral neuropathy which is widespread nerve damage, often caused by diabetes or alcoholism.  Phantom limb pain following an amputation.  Facial nerve problems (trigeminal neuralgia).  Multiple sclerosis.  Reflex sympathetic dystrophy.  Pain which comes with cancer and cancer chemotherapy.  Entrapment neuropathy such as when pressure is put on a nerve such as in carpal tunnel syndrome.  Back, leg, and hip problems (sciatica).  Spine or back surgery.  HIV Infection or AIDS where nerves are infected by viruses. Your caregiver can explain items in the above list which may apply to you. SYMPTOMS  Characteristics of neuropathic pain are:  Severe, sharp, electric shock-like, shooting, lightening-like, knife-like.  Pins and needles sensation.  Deep burning, deep cold, or deep ache.  Persistent numbness, tingling, or weakness.  Pain resulting from light touch or other stimulus that would not usually cause pain.  Increased sensitivity to something that would normally cause pain, such as a pinprick. Pain may persist for months or years following the healing of damaged tissues. When this happens, pain signals no longer sound an alarm about current injuries or injuries about to happen. Instead, the alarm system itself is not working correctly.  Neuropathic pain may get worse instead of better over time. For some people, it can lead to serious disability. It is important to be aware that severe injury in a limb can occur without a proper, protective pain   response.Burns, cuts, and other injuries may go unnoticed. Without proper treatment, these injuries can become infected or lead to further disability. Take any injury seriously, and consult your caregiver for treatment. DIAGNOSIS   When you have a pain with no known cause, your caregiver will probably ask some specific questions:   Do you have any other conditions, such as diabetes, shingles, multiple sclerosis, or HIV infection?  How would you describe your pain? (Neuropathic pain is often described as shooting, stabbing, burning, or searing.)  Is your pain worse at any time of the day? (Neuropathic pain is usually worse at night.)  Does the pain seem to follow a certain physical pathway?  Does the pain come from an area that has missing or injured nerves? (An example would be phantom limb pain.)  Is the pain triggered by minor things such as rubbing against the sheets at night? These questions often help define the type of pain involved. Once your caregiver knows what is happening, treatment can begin. Anticonvulsant, antidepressant drugs, and various pain relievers seem to work in some cases. If another condition, such as diabetes is involved, better management of that disorder may relieve the neuropathic pain.  TREATMENT  Neuropathic pain is frequently long-lasting and tends not to respond to treatment with narcotic type pain medication. It may respond well to other drugs such as antiseizure and antidepressant medications. Usually, neuropathic problems do not completely go away, but partial improvement is often possible with proper treatment. Your caregivers have large numbers of medications available to treat you. Do not be discouraged if you do not get immediate relief. Sometimes different medications or a combination of medications will be tried before you receive the results you are hoping for. See your caregiver if you have pain that seems to be coming from nowhere and does not go away. Help is available.  SEEK IMMEDIATE MEDICAL CARE IF:   There is a sudden change in the quality of your pain, especially if the change is on only one side of the body.  You notice changes of the skin, such as redness, black or  purple discoloration, swelling, or an ulcer.  You cannot move the affected limbs. Document Released: 11/13/2003 Document Revised: 05/10/2011 Document Reviewed: 11/13/2003 [facility] Patient Information 2015 [place]. This information is not intended to replace advice given to you by your health care provider. Make sure you discuss any questions you have with your health care provider. Chronic Pain Chronic pain can be defined as pain that is off and on and lasts for 3-6 months or longer. Many things cause chronic pain, which can make it difficult to make a diagnosis. There are many treatment options available for chronic pain. However, finding a treatment that works well for you may require trying various approaches until the right one is found. Many people benefit from a combination of two or more types of treatment to control their pain. SYMPTOMS  Chronic pain can occur anywhere in the body and can range from mild to very severe. Some types of chronic pain include:  Headache.  Low back pain.  Cancer pain.  Arthritis pain.  Neurogenic pain. This is pain resulting from damage to nerves. People with chronic pain may also have other symptoms such as:  Depression.  Anger.  Insomnia.  Anxiety. DIAGNOSIS  Your health care provider will help diagnose your condition over time. In many cases, the initial focus will be on excluding possible conditions that could be causing the pain. Depending on your symptoms,  your health care provider may order tests to diagnose your condition. Some of these tests may include:   Blood tests.   CT scan.   MRI.   X-rays.   Ultrasounds.   Nerve conduction studies.  You may need to see a specialist.  TREATMENT  Finding treatment that works well may take time. You may be referred to a pain specialist. He or she may prescribe medicine or therapies, such as:   Mindful meditation or yoga.  Shots (injections) of numbing or pain-relieving  medicines into the spine or area of pain.  Local electrical stimulation.  Acupuncture.   Massage therapy.   Aroma, color, light, or sound therapy.   Biofeedback.   Working with a physical therapist to keep from getting stiff.   Regular, gentle exercise.   Cognitive or behavioral therapy.   Group support.  Sometimes, surgery may be recommended.  HOME CARE INSTRUCTIONS   Take all medicines as directed by your health care provider.   Lessen stress in your life by relaxing and doing things such as listening to calming music.   Exercise or be active as directed by your health care provider.   Eat a healthy diet and include things such as vegetables, fruits, fish, and lean meats in your diet.   Keep all follow-up appointments with your health care provider.   Attend a support group with others suffering from chronic pain. SEEK MEDICAL CARE IF:   Your pain gets worse.   You develop a new pain that was not there before.   You cannot tolerate medicines given to you by your health care provider.   You have new symptoms since your last visit with your health care provider.  SEEK IMMEDIATE MEDICAL CARE IF:   You feel weak.   You have decreased sensation or numbness.   You lose control of bowel or bladder function.   Your pain suddenly gets much worse.   You develop shaking.  You develop chills.  You develop confusion.  You develop chest pain.  You develop shortness of breath.  MAKE SURE YOU:  Understand these instructions.  Will watch your condition.  Will get help right away if you are not doing well or get worse. Document Released: 11/07/2001 Document Revised: 10/18/2012 Document Reviewed: 08/11/2012 [facility] Patient Information 2015 [place], [place]. This information is not intended to replace advice given to you by your health care provider. Make sure you discuss any questions you have with your health care provider.

## 2013-09-17 NOTE — Progress Notes (Signed)
   Subjective:    Patient ID: Shane Rivera, male    DOB: 09/26/1955, 58 y.o.   MRN: [id number]  HPI 58 year old male here for pain medicine to be refilled till he can get into the chronic pain management center in September. Seeing Dr. Clelia CroftShaw for primary care and she is arranging a colonoscopy and will be doing a physical exam. The pain regimen he has been on is oxycodone 30 mg 4 times a day since 1998. [facility] He now knows to see Dr.   Has quit smoking   Hx of transverse myelitis causing chronic pain neuropathic, meds include gabapentin and oxycodone.  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    Review of Systems     Objective:   Physical Exam  Vitals reviewed. Constitutional: He is oriented to person, place, and time. He appears well-developed and well-nourished.  HENT:  Head: Normocephalic.  Eyes: EOM are normal. Pupils are equal, round, and reactive to light.  Cardiovascular: Normal rate, regular rhythm and normal heart sounds.   Pulmonary/Chest: Effort normal.  Musculoskeletal: Normal range of motion. He exhibits tenderness.  Neurological: He is alert and oriented to person, place, and time. He exhibits normal muscle tone. Coordination normal.  Psychiatric: He has a normal mood and affect.          Assessment & Plan:  Continue working with Dr. Clelia CroftShaw and new pain center Get the colonoscopy and cpe soon Will refill oxycodone 30mg  qid for 1 month only, he thought Dr. Clelia CroftShaw was here now, she will be in at 4pm

## 2013-10-15 ENCOUNTER — Encounter: Payer: Self-pay | Admitting: Family Medicine

## 2013-10-15 ENCOUNTER — Ambulatory Visit (INDEPENDENT_AMBULATORY_CARE_PROVIDER_SITE_OTHER): Payer: Self-pay | Admitting: Family Medicine

## 2013-10-15 ENCOUNTER — Other Ambulatory Visit: Payer: Self-pay | Admitting: Family Medicine

## 2013-10-15 VITALS — BP 144/90 | HR 97 | Temp 99.4°F | Resp 18 | Ht 73.0 in | Wt 162.0 lb

## 2013-10-15 DIAGNOSIS — L819 Disorder of pigmentation, unspecified: Secondary | ICD-10-CM

## 2013-10-15 DIAGNOSIS — G894 Chronic pain syndrome: Secondary | ICD-10-CM

## 2013-10-15 DIAGNOSIS — N319 Neuromuscular dysfunction of bladder, unspecified: Secondary | ICD-10-CM

## 2013-10-15 DIAGNOSIS — Z1211 Encounter for screening for malignant neoplasm of colon: Secondary | ICD-10-CM | POA: Insufficient documentation

## 2013-10-15 DIAGNOSIS — M792 Neuralgia and neuritis, unspecified: Secondary | ICD-10-CM

## 2013-10-15 DIAGNOSIS — IMO0002 Reserved for concepts with insufficient information to code with codable children: Secondary | ICD-10-CM

## 2013-10-15 MED ORDER — OXYCODONE HCL 30 MG PO TABS: 30.0000 mg | ORAL_TABLET | Freq: Four times a day (QID) | ORAL | Status: DC | PRN

## 2013-10-15 NOTE — Assessment & Plan Note (Signed)
Ordered referral to GI for his initial colon cancer screening.

## 2013-10-15 NOTE — Assessment & Plan Note (Signed)
Rx for 14 [place] urinary catheters given today, #120/mo, RF x 12.

## 2013-10-15 NOTE — Progress Notes (Signed)
Office Note 10/15/2013  CC:  Chief Complaint  Patient presents with  . Establish Care    HPI:  Shane Rivera is a 58 y.o. White male who is here to establish care. Patient's most recent primary MD[facility] [address]. Old records in EPIC/[facility] EMR were reviewed prior to or during today's visit.  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 Neurontin helps at 300mg  tid dosing.  Lyrica too expensive. Must self cath to empty bladder.  Self medicates sometimes for cystitis.  No ED.  Has initial appt with pain mgmt MD: [facility] in [place] in September (he thinks the 11th or 12th).  Has never had colonoscopy. Says a spot on left forearm just appeared and asks me to look at it today.  Past Medical History  Diagnosis Date  . 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 Arthritis   . Neurogenic bladder     requires i/o cath    No past surgical history on file.  Family History  Problem Relation Age of Onset  . ALS Mother   . ALS Sister   . Cancer Sister [id number]    colon  . ALS Brother     History   Social History  . Marital Status: Divorced    Spouse Name: N/A    Number of Children: N/A  . Years of Education: N/A   Occupational History  . Not on file.   Social History Main Topics  . Smoking status: Current Some Day Smoker -- 1.00 packs/day for 5 years    Types: Cigarettes  . Smokeless tobacco: Not on file     Comment: trying to quit x 07/15  . Alcohol Use: No  . Drug Use: Yes  . Sexual Activity: Yes    Birth Control/ Protection: Abstinence   Other Topics Concern  . Not on file   Social History Narrative   Divorced x 2, 4 children.  One grand child.   [place]   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   Tobacco: quit  08/2013.  10 pack-yr history.   Alcohol: none   Drugs: none    Outpatient Encounter Prescriptions as of 10/15/2013  Medication Sig  . Catheters MISC 1 each by Does not apply route 5 (five) times daily.  Marland Kitchen. gabapentin (NEURONTIN) 300 MG capsule Take 300 mg by mouth 3 (three) times daily.  Marland Kitchen. oxycodone (ROXICODONE) 30 MG immediate release tablet Take 1 tablet (30 mg total) by mouth every 6 (six) hours as needed for pain.  . [DISCONTINUED] oxycodone (ROXICODONE) 30 MG immediate release tablet Take 1 tablet (30 mg total) by mouth every 6 (six) hours as needed for pain. May fill medication on or after 08/18/13.    No Known Allergies  ROS Review of Systems  Constitutional: Negative for fever and fatigue.  HENT: Negative for congestion and sore throat.   Eyes: Negative for visual disturbance.  Respiratory: Negative for cough.   Cardiovascular: Negative for chest pain.  Gastrointestinal: Negative for nausea and abdominal pain.  Genitourinary: Negative for dysuria.  Musculoskeletal: Negative for back pain and joint swelling.  Skin: Negative for rash.  Neurological: Negative for weakness and headaches.  Hematological: Negative for adenopathy.    PE; Blood pressure 144/90, pulse 97,  temperature 99.4 F (37.4 C), temperature source Temporal, resp. rate 18, height 6\' 1"  (1.854 m), weight 162 lb (73.483 kg), SpO2 96.00%. Gen: Alert, well appearing.  Patient is oriented to person, place, time, and situation. ZOX:WRUE: no injection, icteris, swelling, or exudate.  EOMI, PERRLA. Mouth: lips without lesion/swelling.  Oral mucosa pink and moist. Oropharynx without erythema, exudate, or swelling.  Dentition in disrepair. Neck - No masses or thyromegaly or limitation in range of motion CV: RRR, no m/r/g.   LUNGS: CTA bilat, nonlabored resps, good aeration in all lung fields. EXT: no clubbing, cyanosis, or edema.  SKIN: left forearm with two 3 mm oblong/oval-shaped pigmented macules, one similar lesion  on right forearm  Pertinent labs:  None today  ASSESSMENT AND PLAN:   New pt;   CHRONIC PAIN SYNDROME Neuropathic pain as residual from transverse myelitis approximately 15 years ago. Will bridge pain med tx to get him to his initial pain mgmt visit next month in [place]: oxycodone 30mg , 1 q6h prn, #120. I informed the patient that I would not be prescribing his pain med on a chronic basis and he expressed understanding/agreement with this.  Neurogenic bladder Rx for 14 French urinary catheters given today, #120/mo, RF x 12.  Pigmented skin lesions Appear c/w solar lentigo lesions but will have patient return for punch biopsy of one of the lesions in 6 wks.  Colon cancer screening Ordered referral to GI for his initial colon cancer screening.   An After Visit Summary was printed and given to the patient.  Return in about 6 weeks (around 11/26/2013) for skin biopsy + fasting labs.

## 2013-10-15 NOTE — Assessment & Plan Note (Signed)
Appear c/w solar lentigo lesions but will have patient return for punch biopsy of one of the lesions in 6 wks.

## 2013-10-15 NOTE — Progress Notes (Signed)
Pre visit review using our clinic review tool, if applicable. No additional management support is needed unless otherwise documented below in the visit note. 

## 2013-10-15 NOTE — Assessment & Plan Note (Signed)
Neuropathic pain as residual from transverse myelitis approximately 15 years ago. Will bridge pain med tx to get him to his initial pain mgmt visit next month in [place]: oxycodone 30mg , 1 q6h prn, #120. I informed the patient that I would not be prescribing his pain med on a chronic basis and he expressed understanding/agreement with this.

## 2013-10-31 ENCOUNTER — Encounter: Payer: Self-pay | Admitting: Family Medicine

## 2013-11-12 ENCOUNTER — Telehealth: Payer: Self-pay

## 2013-11-12 DIAGNOSIS — G894 Chronic pain syndrome: Secondary | ICD-10-CM

## 2013-11-12 NOTE — Telephone Encounter (Signed)
Pt came in today requesting pain medication to last until 11/23/2013 until his pain management appt. He states he will run out tomorrow. Please advise. Pt in the lobby.

## 2013-11-13 NOTE — Telephone Encounter (Signed)
I asked that his appt with the pain mgmt clinic in [place] be verified by our CMA, Rae Halsted. She called and they stated that he DID NOT have an appt, so he essentially lied to [place] today. I declined to rx pain for him today.

## 2013-11-14 MED ORDER — OXYCODONE HCL 30 MG PO TABS: 30.0000 mg | ORAL_TABLET | Freq: Four times a day (QID) | ORAL | Status: DC | PRN

## 2013-11-14 NOTE — Telephone Encounter (Signed)
Rx given to patient.

## 2013-11-14 NOTE — Telephone Encounter (Signed)
Rx printed. thx

## 2013-11-14 NOTE — Telephone Encounter (Signed)
Patient states he has an appt with [facility] 12/13/13 8:45AM [phone]. Advised patient to CB later to see if Rx is ready for pickup. Patient does not have a phone for [place] to contact him.

## 2013-11-14 NOTE — Telephone Encounter (Signed)
Called [facility] and confirmed appointment, 12/13/2013 with Dr Roderic Ovens. Can pt have Rx of pain medicine until then?

## 2013-11-26 ENCOUNTER — Ambulatory Visit: Payer: Medicaid Other | Admitting: Family Medicine

## 2013-12-07 ENCOUNTER — Ambulatory Visit (INDEPENDENT_AMBULATORY_CARE_PROVIDER_SITE_OTHER): Payer: Self-pay | Admitting: Family Medicine

## 2013-12-07 VITALS — BP 130/80 | HR 74 | Temp 98.1°F | Resp 16 | Ht 73.0 in | Wt 163.4 lb

## 2013-12-07 DIAGNOSIS — G894 Chronic pain syndrome: Secondary | ICD-10-CM

## 2013-12-07 MED ORDER — OXYCODONE HCL 30 MG PO TABS: 30.0000 mg | ORAL_TABLET | Freq: Four times a day (QID) | ORAL | Status: DC | PRN

## 2013-12-07 MED ORDER — CATHETERS MISC: 1.0000 | Freq: Every day | Status: DC

## 2013-12-07 NOTE — Progress Notes (Signed)
Missing Rx for Oxy to be filled on 12/09/13 was at my desk. After reading the note here, I shredded it.

## 2013-12-27 NOTE — Progress Notes (Signed)
Subjective:    Patient ID: Shane Rivera, male    DOB: 11/20/1955, 58 y.o.   MRN: [id number] This chart was scribed for Norberto SorensonEva Jabes Primo, MD by Jolene Provostobert Halas, Medical Scribe. This patient was seen in Room 2 and the patient's care was started a 12:36 PM. Chief Complaint  Patient presents with  . Medication Refill    oxycodone     HPI Past Medical History  Diagnosis Date  . 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 Arthritis   . Neurogenic bladder     requires i/o cath   Current Outpatient Prescriptions on File Prior to Visit  Medication Sig Dispense Refill  . gabapentin (NEURONTIN) 300 MG capsule Take 300 mg by mouth 3 (three) times daily.       No current facility-administered medications on file prior to visit.   No Known Allergies  HPI Comments: Shane Rivera is a 58 y.o. male who [facility] medication refill. Pt is undergoing pain management therapy for his transverse myelitis. Pt has been unable to afford placement at a pain clinic.  Pt tried to establish care with Dr. Michele McalpineMcAllen. However, the Dr. did not want to provide the pt's chronic narcotic treatment. Pt said he would schedule an appmt with the [facility]. However, pt told Dr. Michele McalpineMcAllen that he had an appt scheduled, but the office called the pain management clinic and was told pt did not have an appmt scheduled. Pt scheduled an appmt after that which was confirmed by Dr. Earma ReadingMcAllens staff. After that 1 refill of oxycodone was given.   Today, Pt states that his appointment has been rescheduled three times by the pain clinic staff because of scheduling issues, but will finally be seen on November 2nd at the [facility]. Pt self catheterizes 5 times per day, and needs a refill on his catheters.    Review of Systems  Constitutional: Negative for fever,  chills, diaphoresis, activity change and appetite change.  Musculoskeletal: Positive for arthralgias, back pain, gait problem, joint swelling and myalgias.  Skin: Negative for color change, rash and wound.  Neurological: Positive for weakness and numbness.  Hematological: Negative for adenopathy. Does not bruise/bleed easily.       Objective:   Physical Exam  Nursing note and vitals reviewed. Constitutional: He is oriented to person, place, and time. He appears well-developed and well-nourished. No distress.  HENT:  Head: Normocephalic and atraumatic.  Eyes: No scleral icterus.  Pulmonary/Chest: Effort normal.  Neurological: He is alert and oriented to person, place, and time.  Skin: Skin is warm and dry. He is not diaphoretic.  Psychiatric: He has a normal mood and affect. His behavior is normal.   BP 130/80  Pulse 74  Temp(Src) 98.1 F (36.7 C) (Oral)  Resp 16  Ht 6\' 1"  (1.854 m)  Wt 163 lb 6.4 oz (74.118 kg)  BMI 21.56 kg/m2  SpO2 98%        Assessment & Plan:  [place] controlled substance database reviewed and pt is compliant with his 30mg  oxycodone 120 per month. In the past two months has been seeing Dr. Milinda [facility]McGowen in [facility] [place].  Pt was given an oxycodone prescription to fill 30 days after his prior prescription which would have bee 10/13. He then stated he was leaving for [place] on Sunday so  he needed the prescription early, he was then given a prescription 10/11 and original prescription was shredded. Pt stated that he needed to fill the prescription today, and so new script was written, but prior prescription could not be found. Pt stated that he gave it to us. Prescription was not in trash and could not be found.  See above note by briggs - all oxycodone rx's accounted for. However, pt was councelled that as he recently established w/ a PCP in [place] and has now decided to come back here for his primary care instead could be construed as "doctor shopping" esp  if he goes elsewhere.  Pt has been telling us for >6 mos that he has a pain management appt sched but has not been confirmed or keeps getting rescheduled. Brought in form from [facility] showing that pt does have an appt in 1 mo at pain management - needs to keep this.  Chronic pain syndrome - Plan: oxycodone (ROXICODONE) 30 MG immediate release tablet, DISCONTINUED: oxycodone (ROXICODONE) 30 MG immediate release tablet, DISCONTINUED: oxycodone (ROXICODONE) 30 MG immediate release tablet, DISCONTINUED: oxycodone (ROXICODONE) 30 MG immediate release tablet  Meds ordered this encounter  Medications  . Catheters MISC    Sig: 1 each by Does not apply route 5 (five) times daily.    Dispense:  150 each    Refill:  11    14 french urethral catheters  . DISCONTD: oxycodone (ROXICODONE) 30 MG immediate release tablet    Sig: Take 1 tablet (30 mg total) by mouth every 6 (six) hours as needed for pain. May fill on or after 12/13/13.    Dispense:  120 tablet    Refill:  0  . DISCONTD: oxycodone (ROXICODONE) 30 MG immediate release tablet    Sig: Take 1 tablet (30 mg total) by mouth every 6 (six) hours as needed for pain. May fill on or after 12/13/13.    Dispense:  120 tablet    Refill:  0  . DISCONTD: oxycodone (ROXICODONE) 30 MG immediate release tablet    Sig: Take 1 tablet (30 mg total) by mouth every 6 (six) hours as needed for pain. May fill on or after 12/09/13.    Dispense:  120 tablet    Refill:  0  . oxycodone (ROXICODONE) 30 MG immediate release tablet    Sig: Take 1 tablet (30 mg total) by mouth every 6 (six) hours as needed for pain. May fill today due to traveling out of [place]    Dispense:  120 tablet    Refill:  0    I personally performed the services described in this documentation, which was scribed in my presence. The recorded information has been reviewed and considered, and addended by me as needed.  Norberto SorensonEva Khailee Mick, MD MPH

## 2014-01-04 ENCOUNTER — Encounter: Payer: Self-pay | Admitting: Family Medicine

## 2014-01-04 ENCOUNTER — Ambulatory Visit (INDEPENDENT_AMBULATORY_CARE_PROVIDER_SITE_OTHER): Payer: Self-pay | Admitting: Family Medicine

## 2014-01-04 VITALS — BP 142/88 | HR 88 | Temp 98.6°F | Resp 16 | Ht 73.0 in | Wt 164.0 lb

## 2014-01-04 DIAGNOSIS — G894 Chronic pain syndrome: Secondary | ICD-10-CM

## 2014-01-04 MED ORDER — GABAPENTIN 300 MG PO CAPS: 300.0000 mg | ORAL_CAPSULE | Freq: Three times a day (TID) | ORAL | Status: DC

## 2014-01-04 MED ORDER — OXYCODONE HCL 30 MG PO TABS: 30.0000 mg | ORAL_TABLET | Freq: Four times a day (QID) | ORAL | Status: DC | PRN

## 2014-01-04 NOTE — Progress Notes (Signed)
Subjective:    Patient ID: Verneda Skillerry F Croswell, male    DOB: 01/28/1956, 58 y.o.   MRN: [id number] Chief Complaint  Patient presents with  . Medication Refill  . Referral    pain clinic    HPI  Mr. Hart RochesterHollis returns requesting another refill on his chronic narcotics - oxycodone 30mg  IR q6 hrs (#120/mo).   He reports that he was seen at [facility] by Dr. Roderic OvensNorth - states that they would not rx him his desired narcotic - told him that he should have a pain pump put in and talked about doing injections- he does not have the $$ to pay for this nor desire it.  He states he has called a clinic in [place] called [facility] - they are a national clinic that has various offices that he could be seen at as he is traveling for work. States that he has talked to them and all they need are a referral from myself - provides the phone #. States he looked into [facility] but he cannot afford that out of pocket.  Pt reports that he has a +FHx of ALS and that he tested + for the gene mutation - all of his immediate family members are deceased from ALS.  On chart review, he told one of my colleagues that he was given only 18 mos to live - this was over 18 mos ago.  Pt reports he has chronic burning low back and lower extremity neuropathic pain from transverse myelitis since 1998.  Unfortunately, we are lacking in objective data to support this.  Requests a refill on his gabapentin - taking 300mg  tid.  He travels all over the [place] for work and is going to [place] this week.  Mr. Hart RochesterHollis self-catheterizes 5x/d.  Past Medical History  Diagnosis Date  . 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 Arthritis   . Neurogenic bladder     requires i/o cath   No past surgical history on file. Current Outpatient Prescriptions on File Prior to Visit    Medication Sig Dispense Refill  . Catheters MISC 1 each by Does not apply route 5 (five) times daily. 150 each 11  . gabapentin (NEURONTIN) 300 MG capsule Take 300 mg by mouth 3 (three) times daily.    Marland Kitchen. oxycodone (ROXICODONE) 30 MG immediate release tablet Take 1 tablet (30 mg total) by mouth every 6 (six) hours as needed for pain. May fill today due to traveling out of [place] 120 tablet 0   No current facility-administered medications on file prior to visit.   No Known Allergies Family History  Problem Relation Age of Onset  . ALS Mother   . ALS Sister   . Cancer Sister [id number]    colon  . ALS Brother    History   Social History  . Marital Status: Divorced    Spouse Name: N/A    Number of Children: N/A  . Years of Education: N/A   Social History Main Topics  . Smoking status: Current Some Day Smoker -- 1.00 packs/day for 5 years    Types: Cigarettes  . Smokeless tobacco: None     Comment: trying to quit x 07/15  . Alcohol Use: No  . Drug Use: Yes  . Sexual Activity: Yes    Birth Control/ Protection: Abstinence  Other Topics Concern  . None   Social History Narrative   Divorced x 2, 4 children.  One grand child.   [place] in [place].   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   Tobacco: quit 08/2013.  10 pack-yr history.   Alcohol: none   Drugs: none     Review of Systems  Constitutional: Negative for fever, chills, activity change, appetite change and unexpected weight change.  Genitourinary: Positive for difficulty urinating. Negative for frequency and decreased urine volume.  Musculoskeletal: Positive for myalgias, back pain and arthralgias. Negative for joint swelling and gait problem.  Skin: Negative for color change and wound.  Neurological: Negative for dizziness and light-headedness.       Objective:  BP 142/88 mmHg  Pulse 88  Temp(Src) 98.6 F (37 C)  Resp 16  Ht 6\' 1"  (1.854 m)  Wt 164 lb (74.39 kg)  BMI 21.64 kg/m2  SpO2 95%  Physical  Exam  Constitutional: He is oriented to person, place, and time. He appears well-developed and well-nourished. No distress.  HENT:  Head: Normocephalic and atraumatic.  Eyes: No scleral icterus.  Pulmonary/Chest: Effort normal.  Neurological: He is alert and oriented to person, place, and time.  Skin: Skin is warm and dry. He is not diaphoretic.  Psychiatric: He has a normal mood and affect. His behavior is normal.          Assessment & Plan:   Chronic pain syndrome - Plan: oxycodone (ROXICODONE) 30 MG immediate release tablet Have completed referral form to [facility] phone # [phone].  They provided a direct phone # for referrals and clinicians of [phone]. They states that they would require $400 for the first visit (which pt states he will be able to afford) and that the will be able to see him this month. Pt informed that I am not comfortable continuing to rx him this dose/quantity of narcotics - esp as we are not in the habit of treating people with chronic narcotic therapy in our clinic - so this will be the last rx I will provide to him.   I was able to access pt's [facility] which showed  I discussed the case with the Pain Medicine fellow at [facility] who noted that pt's urine at his visit there several days ago was + for oxymorphone which he would not have from taking oxycodone so they did not, and will not, prescribe him any narcotic pain medications. Review of Care Everywhere shows that pt was previously followed by [facility]. Tyson AliasJohn Galbreath [facility] who was prescribing to the pt until he was contacted by Dr. Almedia BallsSam Kelly in March 2015 when pt presented to Dr. Landry DykeKelly's office for narcotics and Dr. Tresa EndoKelly found that pt was getting them from monthly Dr. Corine ShelterGalbreath on the [facility] narcotic registry.  There are numerous notes that pt has been using multiple home addresses and pharmacies - which pt  attributes to his traveling with his job.  Pt presented to our office requesting narcotic therapy in April 2015 stating that he had just moved back here from [place] and that he just needed medications until he could see a "Dr. Manson PasseyBrown."  He then 2 days later tried to establish [facility] who contacted his prior provider Mrs. Manson PasseyBrown, NP, who saw him the day prior to his initial visit to our clinic (so just 3d prior) and she reported that she stopped prescribing to him  when she found he was prescribed #120 oxycodone from [place] the same month. When I first saw pt 6 mos ago, he told me that he could no longer be seen at [facility] as he missed to many appointments which was not accurate.  I then referred him to Dr. Chrissie Noa in [place] and pt stated that his initial new patient appointment was cancelled and then rescheduled 2 months later. Then he decided he would be seen at the [facility].  Then pt established with a primary care doctor with  - Dr. Milinda Cave but Dr. Milinda Cave actually has his staff call the pain center and found that the pt did NOT have an appointment scheduled (when he was asking for a prescription just to hold him over until his appointment) after which he refused to continue filling his narcotics so pt came back to see me again.  He was last seen by a neurologist Dr. Maury Dus in January who was unable to confirm pt's diagnoses and pt did not follow up with him further.  He told his prior PCP Dr. Corine Shelter that he was diagnosed with ALS at [facility] in ear;u 2-14.  We do have access to [facility] records but have not found anything that confirms this.  [facility] has no records of pt ever being seen there.  There are no labs or imaging studies at all avail through [place], [place], [place], [place], [place] that shows any confirmation of his hsitory  Care Everywhere shows that in 2011 pt was seen at [facility] pain clinic  referred by PCP Dr. Marlaine Hind (internal medicine at [facility] in [place]) as was on OxyContin 80mg  tid with oxycodone 15 mg qid prn. Meds ordered this encounter  Medications  . Ascorbic Acid (VITAMIN C) 100 MG tablet    Sig: Take 100 mg by mouth daily.  . Ascorbic Acid (VITAMIN C) 1000 MG tablet    Sig: Take 1,000 mg by mouth daily.  Marland Kitchen gabapentin (NEURONTIN) 300 MG capsule    Sig: Take 1 capsule (300 mg total) by mouth 3 (three) times daily.    Dispense:  270 capsule    Refill:  0  . oxycodone (ROXICODONE) 30 MG immediate release tablet    Sig: Take 1 tablet (30 mg total) by mouth every 6 (six) hours as needed for pain.    Dispense:  120 tablet    Refill:  0    Norberto Sorenson, MD MPH  Today I have utilized the [place] Controlled Substance Registry's online query to confirm compliance regarding the patient's narcotic pain medications. My review reveals monthly narcotic fills that are consistently filled 3-5d early every month.

## 2014-01-23 ENCOUNTER — Ambulatory Visit (INDEPENDENT_AMBULATORY_CARE_PROVIDER_SITE_OTHER): Payer: Self-pay | Admitting: Family Medicine

## 2014-01-23 VITALS — BP 144/76 | HR 81 | Temp 97.4°F | Resp 18 | Ht 72.5 in | Wt 161.8 lb

## 2014-01-23 DIAGNOSIS — Z789 Other specified health status: Secondary | ICD-10-CM

## 2014-01-23 DIAGNOSIS — N319 Neuromuscular dysfunction of bladder, unspecified: Secondary | ICD-10-CM

## 2014-01-23 DIAGNOSIS — G894 Chronic pain syndrome: Secondary | ICD-10-CM

## 2014-01-23 DIAGNOSIS — Z87448 Personal history of other diseases of urinary system: Secondary | ICD-10-CM

## 2014-01-23 DIAGNOSIS — G373 Acute transverse myelitis in demyelinating disease of central nervous system: Secondary | ICD-10-CM

## 2014-01-23 DIAGNOSIS — G0489 Other myelitis: Secondary | ICD-10-CM

## 2014-01-23 MED ORDER — CIPROFLOXACIN HCL 500 MG PO TABS: 500.0000 mg | ORAL_TABLET | Freq: Two times a day (BID) | ORAL | Status: DC

## 2014-01-23 MED ORDER — OXYCODONE HCL 30 MG PO TABS: 30.0000 mg | ORAL_TABLET | Freq: Four times a day (QID) | ORAL | Status: DC | PRN

## 2014-01-23 MED ORDER — CATHETERS MISC: 1.0000 | Freq: Every day | Status: AC

## 2014-01-23 MED ORDER — GABAPENTIN 300 MG PO CAPS: 300.0000 mg | ORAL_CAPSULE | Freq: Three times a day (TID) | ORAL | Status: AC

## 2014-01-23 NOTE — Progress Notes (Signed)
Subjective:  This chart was scribed for Shane Flood, MD by Charline Bills, ED Scribe. The patient was seen in room 10. Patient's care was started at 2:05 PM.   Patient ID: Shane Rivera, male    DOB: 01-Dec-1955, 58 y.o.   MRN: [id number]  Chief Complaint  Patient presents with  . Medication Refill    Oxycodone, Neurontin, Cipro (for catheter use), Catheters- pt is moving to [place] today    HPI HPI Comments: Shane Rivera is a 58 y.o. male who presents to the [facility] for a medication refill of Oxycodone, Neurontin, Cipro and Catheters.   He is primary pt of Dr. Milinda Cave in [facility] but appears he just saw him on 10/15/13 to establish care prior to seeing Dr. Clelia Croft here. Pt has a h/o chronic pain syndrome with h/o transverse myelitis. Pt takes Oxycodone 30 mg q.i.d. and Neurontin 300 mg t.i.d. Per prior notes was referred to [facility] in [place] in 10/2013. Most recently seen by Dr. Clelia Croft 12/07/13 and 01/04/14. Per Dr. Alver Fisher most recent note, pt was seen by Dr. Roderic Ovens at [facility]. Apparently a pain pump and injections were discussed which he declined for cost reasons and did not have the desire to do that treatment. Per last note, pt was planning to establish with [facility] in [place]. Per Dr. Alver Fisher last note, per prescriptions on 01/04/14 was last prescription from her as needed to be followed by pain management. See prior assessment and plan by Dr. Clelia Croft regarding other concerns regarding narcotic pain medicines. Appears he has been seen by multiple offices. Per prior notes, he has also been apparently diagnosed with ALS although unable to obtain records to confirm this. Additionally per Dr. Alver Fisher last note, he has had narcotic refills early by a few days over the past few months.   Pt states that this will be his last visit at Urgent Care today since he is moving to [facility] for a job relocation. 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 Pt has not called to set up an appointment with a PCP or Pain Management Clinic in [place] yet.   Neurogenic Bladder Pt states that he self-catheterizes for transverse myelitis. Pt uses in and out catheters 4-5 times a day. He states that he takes Cipro when he experiences UTIs which includes symptoms of malodor and urinary urgency. Pt states that he has not taken Cipro this year; last use 03/2013. He denies malodor, abdominal pain and any urinary symptoms during this visit.   Transverse Myelitis Pt was diagnosed on 09/27/1996. He reports taking Neurontin x3 daily for a burning sensation in his feet, legs and buttocks. Pt denies taking Lyrica due to finances. Pt reports taking 1 Oxycodone x4 daily. He states that he has taken Oxycodone for approximately 5 years. Pt reports that he has approximately 8 days left of Oxycodone. Pt denies a change in pain during this visit. He states that he has not followed up with Pain Management since he last saw Dr. Clelia Croft.  PCP: [name], MD  Patient Active Problem List   Diagnosis Date Noted  . Neurogenic bladder 10/15/2013  . Pigmented skin lesions 10/15/2013  . Colon cancer screening 10/15/2013  . Transverse myelitis 09/17/2013  . CHRONIC PAIN SYNDROME 11/25/2009   Past Medical History  Diagnosis Date  . 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 Arthritis   . Neurogenic bladder     requires i/o cath   No past surgical history on file. No Known Allergies   Prior to Admission medications   Medication Sig Start Date End Date Taking? Authorizing Provider  Ascorbic Acid (VITAMIN C) 100 MG tablet Take 100 mg by mouth daily.   Yes Historical Provider, MD  Ascorbic Acid (VITAMIN C) 1000 MG tablet Take 1,000 mg by mouth daily.   Yes Historical Provider, MD  Catheters MISC 1 each by Does not  apply route 5 (five) times daily. 12/07/13  Yes Sherren MochaEva N Shaw, MD  gabapentin (NEURONTIN) 300 MG capsule Take 1 capsule (300 mg total) by mouth 3 (three) times daily. 01/04/14  Yes Sherren MochaEva N Shaw, MD  oxycodone (ROXICODONE) 30 MG immediate release tablet Take 1 tablet (30 mg total) by mouth every 6 (six) hours as needed for pain. 01/04/14  Yes Sherren MochaEva N Shaw, MD   History   Social History  . Marital Status: Divorced    Spouse Name: N/A    Number of Children: N/A  . Years of Education: N/A   Occupational History  . Not on file.   Social History Main Topics  . Smoking status: Current Some Day Smoker -- 1.00 packs/day for 5 years    Types: Cigarettes  . Smokeless tobacco: Not on file     Comment: trying to quit x 07/15  . Alcohol Use: No  . Drug Use: Yes  . Sexual Activity: Yes    Birth Control/ Protection: Abstinence   Other Topics Concern  . Not on file   Social History Narrative   Divorced x 2, 4 children.  One grand child.   [place]ives in [place].   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   Tobacco: quit 08/2013.  10 pack-yr history.   Alcohol: none   Drugs: none   Review of Systems  Constitutional: Negative for fever and unexpected weight change.  Respiratory: Negative for chest tightness and shortness of breath.   Cardiovascular: Negative for chest pain and leg swelling.  Gastrointestinal: Negative for abdominal pain.  Genitourinary: Negative.   Neurological: Negative for dizziness, syncope and light-headedness.      Objective:   Physical Exam  Constitutional: He is oriented to person, place, and time. He appears well-developed and well-nourished.  HENT:  Head: Normocephalic and atraumatic.  Eyes: EOM are normal. Pupils are equal, round, and reactive to light.  Neck: No JVD present. Carotid bruit is not present.  Cardiovascular: Normal rate, regular rhythm and normal heart sounds.   No murmur heard. Pulmonary/Chest: Effort normal and breath sounds normal. He has no  rales.  Musculoskeletal: He exhibits no edema.  Neurological: He is alert and oriented to person, place, and time.  Skin: Skin is warm and dry.  Psychiatric: He has a normal mood and affect.  Vitals reviewed.  Filed Vitals:   01/23/14 1240  BP: 144/76  Pulse: 81  Temp: 97.4 F (36.3 C)  TempSrc: Oral  Resp: 18  Height: 6' 0.5" (1.842 m)  Weight: 161 lb 12.8 oz (73.392 kg)  SpO2: 98%   [place] Control Substance Database Reviewed #120 of Oxycodone 30 mg prescribed 11/6, 10/9, 9/16, 8/17, 7/20, 6/20    Assessment & Plan:   Shane Skillerry F Laverdiere is a 58 y.o. male Neurogenic bladder - Plan: gabapentin (NEURONTIN) 300 MG capsule, ciprofloxacin (CIPRO) 500 MG tablet, hx of Transverse myelitis, Chronic  pain disorder - Plan: gabapentin (NEURONTIN) 300 MG capsule, Chronic pain syndrome - Plan: oxycodone (ROXICODONE) 30 MG immediate release tablet  -prior notes reviewed, including plan from Dr. Clelia CroftShaw. Discussed need for follow up with pain management and initially primary provider or emergency room if needed in [place] to be referred to pain mgt locally as moving tonight to [place]. In good faith, I did agree to provide 1 week's worth of oxycodone (to bee filled 02/02/14) to allow a little more time on establishing care in [place]. Discussed concerns of early refills in past, and additionally will have to have any further refills provided by care provider locally in [place]. Refilled neurontin at prior dose for 1 month.   History of urinary self-catheterization - Plan: ciprofloxacin (CIPRO) 500 MG tablet  - asymptomatic at present, no recent UTI, but effective in past with rare self treatment if symptomatic. Cipro Rx given if this occurs, but discussed challenge of not obtaining urine cx if treated prior to specimen, and if not having immediate improvement with self treatment to seek care immediately. Understanding expressed of plan.   Meds ordered this encounter  Medications  . Catheters  MISC    Sig: 1 each by Does not apply route 5 (five) times daily.    Dispense:  150 each    Refill:  11    14 french urethral catheters  . gabapentin (NEURONTIN) 300 MG capsule    Sig: Take 1 capsule (300 mg total) by mouth 3 (three) times daily.    Dispense:  270 capsule    Refill:  0  . oxycodone (ROXICODONE) 30 MG immediate release tablet    Sig: Take 1 tablet (30 mg total) by mouth every 6 (six) hours as needed for pain.    Dispense:  28 tablet    Refill:  0    Do not fill before 02/02/14.  . ciprofloxacin (CIPRO) 500 MG tablet    Sig: Take 1 tablet (500 mg total) by mouth 2 (two) times daily.    Dispense:  20 tablet    Refill:  0   Patient Instructions  I provided 1 week's worth of oxycodone to allow you to see a provider locally where you are moving. You will need o check with a pharmacy locally to make sure this prescription can be filled then as it will be out of state. If not, then you will need to seek care there sooner to discuss this medication and establish with pain management provider locally.   I refilled your Neurontin at the same dose, and catheter prescription if needed.   I also wrote Cipro prescription if you experience the typical symptoms of bladder infection as this has been effective in past, but if any new or worsening symptoms, or the infection symptoms do not improve within a day or town on antibiotics - be seen by medical provider right away.   Return to the clinic or go to the nearest emergency room if any of your symptoms worsen or new symptoms occur.     I personally performed the services described in this documentation, which was scribed in my presence. The recorded information has been reviewed and considered, and addended by me as needed.

## 2014-01-23 NOTE — Patient Instructions (Signed)
I provided 1 week's worth of oxycodone to allow you to see a provider locally where you are moving. You will need o check with a pharmacy locally to make sure this prescription can be filled then as it will be out of state. If not, then you will need to seek care there sooner to discuss this medication and establish with pain management provider locally.   I refilled your Neurontin at the same dose, and catheter prescription if needed.   I also wrote Cipro prescription if you experience the typical symptoms of bladder infection as this has been effective in past, but if any new or worsening symptoms, or the infection symptoms do not improve within a day or town on antibiotics - be seen by medical provider right away.   Return to the clinic or go to the nearest emergency room if any of your symptoms worsen or new symptoms occur.

## 2014-02-04 ENCOUNTER — Ambulatory Visit (INDEPENDENT_AMBULATORY_CARE_PROVIDER_SITE_OTHER): Payer: Self-pay | Admitting: Family Medicine

## 2014-02-04 VITALS — BP 136/88 | HR 88 | Temp 98.1°F | Resp 19 | Ht 72.5 in | Wt 161.4 lb

## 2014-02-04 DIAGNOSIS — G894 Chronic pain syndrome: Secondary | ICD-10-CM

## 2014-02-04 MED ORDER — OXYCODONE HCL 30 MG PO TABS: 30.0000 mg | ORAL_TABLET | Freq: Four times a day (QID) | ORAL | Status: DC | PRN

## 2014-02-04 NOTE — Patient Instructions (Signed)
Mr. Shane Rivera - Please make sure you have an appointment scheduled before you run out of medicine.  I will NOT prescribe you any more pain medication as you have been saying that you are going to follow-up with a pain management clinic for many months and I do not provide chronic pain management so feel uncomfortable prescribing to you any further.  We will be happy to send your records as needed or aide you in transition of your medical care into a clinic that can serve you better.

## 2014-02-04 NOTE — Progress Notes (Signed)
Subjective:  This chart was scribed for Shane SorensonEva Johnathan Tortorelli, MD by Shane Rivera, ED Scribe. This Patient was seen in room 09 and the patients care was started at 6:31 PM   Patient ID: Shane Rivera, male    DOB: 12/02/1955, 58 y.o.   MRN: [id number] Chief Complaint  Patient presents with  . Medication Refill    oxycodone refill    HPI HPI Comments: Shane Rivera is a 58 y.o. male who [facility] for oxycodone refill. He states that he is moving to [place] soon. He states that he needs enough medication until her can get in with a new doctor. Pt does states that he has an appointment [place] with in the next 3 weeks. He states that he has a week left of medication and that's is not enough to cover the time until his next appointment . He states that he would need about 3 weeks of pain medication until he gets settled in his new location.    . Past Medical History  Diagnosis Date  . 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 Arthritis   . Neurogenic bladder     requires i/o cath   Current Outpatient Prescriptions on File Prior to Visit  Medication Sig Dispense Refill  . Ascorbic Acid (VITAMIN C) 100 MG tablet Take 100 mg by mouth daily.    . Ascorbic Acid (VITAMIN C) 1000 MG tablet Take 1,000 mg by mouth daily.    . Catheters MISC 1 each by Does not apply route 5 (five) times daily. 150 each 11  . ciprofloxacin (CIPRO) 500 MG tablet Take 1 tablet (500 mg total) by mouth 2 (two) times daily. 20 tablet 0  . gabapentin (NEURONTIN) 300 MG capsule Take 1 capsule (300 mg total) by mouth 3 (three) times daily. 270 capsule 0  . oxycodone (ROXICODONE) 30 MG immediate release tablet Take 1 tablet (30 mg total) by mouth every 6 (six) hours as needed for pain. 28 tablet 0   No current facility-administered  medications on file prior to visit.   No Known Allergies   Review of Systems   Objective:   BP 136/88 mmHg  Pulse 88  Temp(Src) 98.1 F (36.7 C) (Oral)  Resp 19  Ht 6' 0.5" (1.842 m)  Wt 161 lb 6.4 oz (73.211 kg)  BMI 21.58 kg/m2  SpO2 96%   Physical Exam  Constitutional: He is oriented to person, place, and time. He appears well-developed and well-nourished. No distress.  unhygienic appearance missing multiple teeth.   HENT:  Head: Normocephalic and atraumatic.  Eyes: Conjunctivae and EOM are normal.  Neck: Neck supple. No tracheal deviation present.  Cardiovascular: Normal rate.   Pulmonary/Chest: Effort normal. No respiratory distress.  Musculoskeletal: Normal range of motion.  Neurological: He is alert and oriented to person, place, and time.  Skin: Skin is warm and dry.  Psychiatric: He has a normal mood and affect. His behavior is normal.  Nursing note and vitals reviewed.   Assessment & Plan:   Chronic pain syndrome - Plan: DISCONTINUED: oxycodone (ROXICODONE) 30 MG immediate release tablet Pt has been receiving chronic oxycodone 30mg  qid from me for over 6 months. This entire time pt has always said that he has a pain management appt scheduled but always state that they reschedule  him - he has told me this over 3 times in the past 6 months and has still never been seen at pain management.   Every single month pt always states he is going [place]uf of [place] work and so repeatedly asks for early fills. He has also told me twice that he is moving out of state and so shows up month and month stating he just needs 1 more refill.  He tried to go to [facility] - the same thing happened so they refused to prescribe after 1 mo then pt came back here.   Pt informed, in no uncertain terms, that I will NOT prescribe ANY additional narcotic pain medicine for him - this is his last fill (which he has told me over 6 times) but NO FURTHER OPIATES - THIS IS A HIGH RISK PATIENT THAT  REPEATEDLY LIES ABOUT APPOINTMENTS WITH PAIN MANAGEMENT AND MOVING OUT OF STATE. Meds ordered this encounter  Medications  . oxycodone (ROXICODONE) 30 MG immediate release tablet    Sig: Take 1 tablet (30 mg total) by mouth every 6 (six) hours as needed for pain.    Dispense:  120 tablet    Refill:  0    I personally performed the services described in this documentation, which was scribed in my presence. The recorded information has been reviewed and considered, and addended by me as needed.  Shane SorensonEva Ernestina Joe, MD MPH

## 2014-03-06 ENCOUNTER — Ambulatory Visit (INDEPENDENT_AMBULATORY_CARE_PROVIDER_SITE_OTHER): Payer: Self-pay | Admitting: Family Medicine

## 2014-03-06 VITALS — BP 130/72 | HR 99 | Temp 97.5°F | Resp 16 | Ht 72.5 in | Wt 159.6 lb

## 2014-03-06 DIAGNOSIS — G894 Chronic pain syndrome: Secondary | ICD-10-CM

## 2014-03-06 MED ORDER — OXYCODONE HCL 30 MG PO TABS: 30.0000 mg | ORAL_TABLET | Freq: Four times a day (QID) | ORAL | Status: DC | PRN

## 2014-03-06 NOTE — Progress Notes (Signed)
59 yo man with h/o of transverse myelitis since 1998 with subsequent burning feet and need for self catheterization.  He has pain clinic appt next March 12, 2015  He has been seeing Dr. Clelia CroftShaw, and before that a clinic  [place] but he missed a couple appts there.  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  Objective:  NAD Patient has good eye contact and is moving easily in room.  This chart was scribed in my presence and reviewed by me personally.    ICD-9-CM ICD-10-CM   1. Chronic pain syndrome 338.4 G89.4 oxycodone (ROXICODONE) 30 MG immediate release tablet     Signed, Elvina SidleKurt Desmund Elman, MD

## 2014-03-12 ENCOUNTER — Ambulatory Visit (INDEPENDENT_AMBULATORY_CARE_PROVIDER_SITE_OTHER): Payer: Self-pay | Admitting: Family Medicine

## 2014-03-12 VITALS — BP 140/82 | HR 82 | Temp 98.1°F | Resp 16 | Ht 71.5 in | Wt 158.0 lb

## 2014-03-12 DIAGNOSIS — G894 Chronic pain syndrome: Secondary | ICD-10-CM

## 2014-03-12 MED ORDER — OXYCODONE HCL 30 MG PO TABS: 30.0000 mg | ORAL_TABLET | Freq: Four times a day (QID) | ORAL | Status: AC | PRN

## 2014-03-12 NOTE — Patient Instructions (Signed)
Make sure that you keep your appointment with the pain clinic. These pills are enough to last few 30 days taking them 4 times a day. Do not take extra pills. In fact, if you can get by on some days with taking last please do so.

## 2014-03-12 NOTE — Progress Notes (Signed)
Subjective: Patient was scheduled to [facility]. They called him and changed the appointment to February 9. Dr. Faustino CongressLowenstein given him medicine to last until his appointment, but now he does not have medicine to tide him through. He has a history of transverse myelitis since 1998 with long-term chronic pain med use. Formerly he went to a chronic pain management clinic in [place], moved here for 5 years ago. He says that when I saw him last spring and made a referral to a pain clinic that he never got that. His record it appears that he has been getting about monthly prescriptions for pain medication, sometimes a few days early. He travels with a software related job.  Objective: Discussed with patient. Did not reexamine him.  Assessment: Chronic pain syndrome  Plan: Represcribed the oxycodone for one more month 120 pills. He will be expected to be in a pain clinic at that time.

## 2014-10-11 ENCOUNTER — Other Ambulatory Visit: Payer: Self-pay | Admitting: Family Medicine

## 2014-10-14 ENCOUNTER — Other Ambulatory Visit: Payer: Self-pay | Admitting: Family Medicine

## 2014-10-17 ENCOUNTER — Other Ambulatory Visit: Payer: Self-pay | Admitting: Family Medicine

## 2014-10-18 ENCOUNTER — Other Ambulatory Visit: Payer: Self-pay | Admitting: Family Medicine

## 2014-10-21 ENCOUNTER — Other Ambulatory Visit: Payer: Self-pay | Admitting: Family Medicine

## 2014-10-22 ENCOUNTER — Other Ambulatory Visit: Payer: Self-pay | Admitting: Family Medicine

## 2021-04-01 DEATH — deceased
